# Patient Record
Sex: Female | Born: 1960 | Race: White | Hispanic: No | Smoking: Never smoker
Health system: Southern US, Community
[De-identification: ages and names within clinical notes are randomized; demographics above are authoritative.]

## PROBLEM LIST (undated history)

## (undated) DIAGNOSIS — M1990 Unspecified osteoarthritis, unspecified site: Secondary | ICD-10-CM

## (undated) DIAGNOSIS — S93409A Sprain of unspecified ligament of unspecified ankle, initial encounter: Secondary | ICD-10-CM

## (undated) HISTORY — PX: WISDOM TOOTH EXTRACTION: SHX21

## (undated) HISTORY — DX: Unspecified osteoarthritis, unspecified site: M19.90

## (undated) HISTORY — DX: Sprain of unspecified ligament of unspecified ankle, initial encounter: S93.409A

---

## 1995-10-14 HISTORY — PX: TUBAL LIGATION: SHX77

## 2005-10-13 HISTORY — PX: LYMPHADENECTOMY: SHX15

## 2006-04-03 ENCOUNTER — Ambulatory Visit: Payer: Self-pay | Admitting: Unknown Physician Specialty

## 2007-06-04 ENCOUNTER — Ambulatory Visit: Payer: Self-pay | Admitting: Rheumatology

## 2008-02-10 ENCOUNTER — Ambulatory Visit: Payer: Self-pay | Admitting: Obstetrics and Gynecology

## 2008-05-26 ENCOUNTER — Ambulatory Visit: Payer: Self-pay | Admitting: Unknown Physician Specialty

## 2009-04-02 ENCOUNTER — Ambulatory Visit: Payer: Self-pay | Admitting: Obstetrics and Gynecology

## 2010-05-09 ENCOUNTER — Ambulatory Visit: Payer: Self-pay | Admitting: Obstetrics and Gynecology

## 2011-05-15 ENCOUNTER — Ambulatory Visit: Payer: Self-pay | Admitting: Obstetrics and Gynecology

## 2011-05-30 ENCOUNTER — Ambulatory Visit: Payer: Self-pay | Admitting: Unknown Physician Specialty

## 2012-05-17 ENCOUNTER — Ambulatory Visit: Payer: Self-pay | Admitting: Obstetrics and Gynecology

## 2013-05-23 ENCOUNTER — Ambulatory Visit: Payer: Self-pay | Admitting: Obstetrics and Gynecology

## 2013-12-15 ENCOUNTER — Ambulatory Visit: Payer: Self-pay | Admitting: Family Medicine

## 2014-01-04 ENCOUNTER — Ambulatory Visit: Payer: Self-pay | Admitting: Unknown Physician Specialty

## 2014-05-25 ENCOUNTER — Ambulatory Visit: Payer: Self-pay | Admitting: Obstetrics and Gynecology

## 2014-10-23 DIAGNOSIS — K449 Diaphragmatic hernia without obstruction or gangrene: Secondary | ICD-10-CM | POA: Insufficient documentation

## 2015-05-17 ENCOUNTER — Ambulatory Visit (INDEPENDENT_AMBULATORY_CARE_PROVIDER_SITE_OTHER): Payer: BLUE CROSS/BLUE SHIELD | Admitting: Physician Assistant

## 2015-05-17 ENCOUNTER — Other Ambulatory Visit: Payer: Self-pay

## 2015-05-17 ENCOUNTER — Encounter: Payer: Self-pay | Admitting: Physician Assistant

## 2015-05-17 VITALS — BP 122/78 | HR 84 | Temp 97.8°F | Resp 16 | Wt 139.8 lb

## 2015-05-17 DIAGNOSIS — K648 Other hemorrhoids: Secondary | ICD-10-CM | POA: Diagnosis not present

## 2015-05-17 DIAGNOSIS — R1030 Lower abdominal pain, unspecified: Secondary | ICD-10-CM

## 2015-05-17 DIAGNOSIS — M3130 Wegener's granulomatosis without renal involvement: Secondary | ICD-10-CM | POA: Insufficient documentation

## 2015-05-17 DIAGNOSIS — A499 Bacterial infection, unspecified: Secondary | ICD-10-CM | POA: Diagnosis not present

## 2015-05-17 DIAGNOSIS — R002 Palpitations: Secondary | ICD-10-CM | POA: Insufficient documentation

## 2015-05-17 DIAGNOSIS — IMO0001 Reserved for inherently not codable concepts without codable children: Secondary | ICD-10-CM | POA: Insufficient documentation

## 2015-05-17 DIAGNOSIS — N760 Acute vaginitis: Secondary | ICD-10-CM | POA: Diagnosis not present

## 2015-05-17 DIAGNOSIS — R030 Elevated blood-pressure reading, without diagnosis of hypertension: Secondary | ICD-10-CM

## 2015-05-17 DIAGNOSIS — F439 Reaction to severe stress, unspecified: Secondary | ICD-10-CM | POA: Insufficient documentation

## 2015-05-17 DIAGNOSIS — K644 Residual hemorrhoidal skin tags: Secondary | ICD-10-CM

## 2015-05-17 DIAGNOSIS — K219 Gastro-esophageal reflux disease without esophagitis: Secondary | ICD-10-CM | POA: Insufficient documentation

## 2015-05-17 DIAGNOSIS — B9689 Other specified bacterial agents as the cause of diseases classified elsewhere: Secondary | ICD-10-CM

## 2015-05-17 LAB — POCT URINALYSIS DIPSTICK
BILIRUBIN UA: NEGATIVE
Glucose, UA: NEGATIVE
KETONES UA: NEGATIVE
LEUKOCYTES UA: NEGATIVE
NITRITE UA: NEGATIVE
PROTEIN UA: NEGATIVE
RBC UA: NEGATIVE
SPEC GRAV UA: 1.01
Urobilinogen, UA: 0.2
pH, UA: 7

## 2015-05-17 MED ORDER — HYDROCORTISONE ACETATE 25 MG RE SUPP: 25.0000 mg | Freq: Two times a day (BID) | RECTAL | Status: DC | PRN

## 2015-05-17 MED ORDER — METRONIDAZOLE 1.3 % VA GEL: 1.0000 [in_us] | Freq: Two times a day (BID) | VAGINAL | Status: DC

## 2015-05-17 NOTE — Patient Instructions (Addendum)
Bacterial Vaginosis Bacterial vaginosis is a vaginal infection that occurs when the normal balance of bacteria in the vagina is disrupted. It results from an overgrowth of certain bacteria. This is the most common vaginal infection in women of childbearing age. Treatment is important to prevent complications, especially in pregnant women, as it can cause a premature delivery. CAUSES  Bacterial vaginosis is caused by an increase in harmful bacteria that are normally present in smaller amounts in the vagina. Several different kinds of bacteria can cause bacterial vaginosis. However, the reason that the condition develops is not fully understood. RISK FACTORS Certain activities or behaviors can put you at an increased risk of developing bacterial vaginosis, including:  Having a new sex partner or multiple sex partners.  Douching.  Using an intrauterine device (IUD) for contraception. Women do not get bacterial vaginosis from toilet seats, bedding, swimming pools, or contact with objects around them. SIGNS AND SYMPTOMS  Some women with bacterial vaginosis have no signs or symptoms. Common symptoms include:  Grey vaginal discharge.  A fishlike odor with discharge, especially after sexual intercourse.  Itching or burning of the vagina and vulva.  Burning or pain with urination. DIAGNOSIS  Your health care provider will take a medical history and examine the vagina for signs of bacterial vaginosis. A sample of vaginal fluid may be taken. Your health care provider will look at this sample under a microscope to check for bacteria and abnormal cells. A vaginal pH test may also be done.  TREATMENT  Bacterial vaginosis may be treated with antibiotic medicines. These may be given in the form of a pill or a vaginal cream. A second round of antibiotics may be prescribed if the condition comes back after treatment.  HOME CARE INSTRUCTIONS   Only take over-the-counter or prescription medicines as  directed by your health care provider.  If antibiotic medicine was prescribed, take it as directed. Make sure you finish it even if you start to feel better.  Do not have sex until treatment is completed.  Tell all sexual partners that you have a vaginal infection. They should see their health care provider and be treated if they have problems, such as a mild rash or itching.  Practice safe sex by using condoms and only having one sex partner. SEEK MEDICAL CARE IF:   Your symptoms are not improving after 3 days of treatment.  You have increased discharge or pain.  You have a fever. MAKE SURE YOU:   Understand these instructions.  Will watch your condition.  Will get help right away if you are not doing well or get worse. FOR MORE INFORMATION  Centers for Disease Control and Prevention, Division of STD Prevention: www.cdc.gov/std American Sexual Health Association (ASHA): www.ashastd.org  Document Released: 09/29/2005 Document Revised: 07/20/2013 Document Reviewed: 05/11/2013 ExitCare Patient Information 2015 [employer]. This information is not intended to replace advice given to you by your health care provider. Make sure you discuss any questions you have with your health care provider.  

## 2015-05-17 NOTE — Progress Notes (Signed)
Patient ID: LOGHAN SUBIA, female   DOB: 12-Jan-1961, 54 y.o.   MRN: [id number]   Patient: ADAIJAH ENDRES Female    DOB: 1961/01/29   54 y.o.   MRN: [id number] Visit Date: 05/17/2015  Today's Provider: Mar Daring, PA-C   Chief Complaint  Patient presents with  . Abdominal Pain    lower abdominal pain   Subjective:    HPI Carol Theys is a 54 year old female that comes to the office today with lower abdominal pressure and discomfort. She is concerned she may have a UTI. She did notice increased urinary frequency over the last 2 days but denies any dysuria or hematuria. She does mention that she has had frequent bacterial vaginosis infections and feels she may have one now instead of a UTI because she has had some vaginal discharge.  She denies any fevers, chills, nausea or vomiting, or flank pain.   Previous Medications   BECLOMETHASONE DIPROPIONATE (QNASL) 80 MCG/ACT AERS    Place 1 spray into the nose daily.   CLONAZEPAM (KLONOPIN) 0.5 MG TABLET    Take 0.5-1 tablets by mouth 2 (two) times daily as needed.   CYANOCOBALAMIN (V-R VITAMIN B-12 TR) 1000 MCG TBCR    Take by mouth.   ESOMEPRAZOLE (NEXIUM) 40 MG CAPSULE    Take 1 capsule by mouth daily.   IBUPROFEN (ADVIL,MOTRIN) 200 MG TABLET    Take 200 mg by mouth every 6 (six) hours as needed.   MULTIPLE VITAMIN PO    Take 1 tablet by mouth daily.    Review of Systems  Constitutional: Negative for fever, chills and fatigue.  Gastrointestinal: Positive for abdominal pain. Negative for nausea, vomiting, diarrhea and constipation.  Genitourinary: Positive for frequency, vaginal discharge and pelvic pain (pressure). Negative for dysuria, urgency, hematuria, flank pain, vaginal bleeding, genital sores, vaginal pain, menstrual problem and dyspareunia.    History  Substance Use Topics  . Smoking status: Never Smoker   . Smokeless tobacco: Never Used  . Alcohol Use: 0.0 oz/week    0 Standard drinks or equivalent per week     Comment:  MODERATE USE   Objective:   BP 122/78 mmHg  Pulse 84  Temp(Src) 97.8 F (36.6 C) (Oral)  Resp 16  Wt 139 lb 12.8 oz (63.413 kg)  SpO2 98%  LMP 05/12/2015  Physical Exam  Abdominal: Soft. Bowel sounds are normal. She exhibits no distension and no mass. There is tenderness in the suprapubic area. There is no rigidity, no rebound, no guarding and no CVA tenderness.  Genitourinary: Uterus normal. Pelvic exam was performed with patient supine. There is no rash, tenderness, lesion or injury on the right labia. There is no rash, tenderness, lesion or injury on the left labia. Cervix exhibits no motion tenderness, no discharge and no friability. Right adnexum displays no mass, no tenderness and no fullness. Left adnexum displays no mass, no tenderness and no fullness. There is erythema in the vagina. No tenderness or bleeding in the vagina. No foreign body around the vagina. No signs of injury around the vagina. Vaginal discharge (thick, white) found.  Vitals reviewed.       Assessment & Plan:     1. Lower abdominal pain - POCT urinalysis dipstick - POCT Wet Prep [facility])  2. Bacterial vaginosis Cells visualized on wet prep. She has asked for the metronidazole gel versus the pill. Metronidazole prescription sent in as below.  *Of note: Pharmacy called and stated they only had metronidazole 0.75% gel  in the tube thus the prescription was changed to the 0.75 versus the 1.3%. - MetroNIDAZOLE 1.3 % GEL; Place 1 inch vaginally 2 (two) times daily.  Dispense: 1 Tube; Refill: 0  3. External hemorrhoid Stable and well controlled with the Anusol. She asked for a refill. - hydrocortisone (ANUSOL-HC) 25 MG suppository; Place 1 suppository (25 mg total) rectally 2 (two) times daily as needed for hemorrhoids or itching.  Dispense: 12 suppository; Refill: 1   Follow up: No Follow-up on file.

## 2015-05-18 ENCOUNTER — Telehealth: Payer: Self-pay | Admitting: Family Medicine

## 2015-05-18 LAB — POCT WET PREP (WET MOUNT)

## 2015-05-18 NOTE — Telephone Encounter (Signed)
Has a RX that he has a question about.  Thanks, Con Memos

## 2015-08-21 ENCOUNTER — Other Ambulatory Visit: Payer: Self-pay | Admitting: Obstetrics and Gynecology

## 2015-08-21 DIAGNOSIS — Z1231 Encounter for screening mammogram for malignant neoplasm of breast: Secondary | ICD-10-CM

## 2015-08-30 ENCOUNTER — Ambulatory Visit
Admission: RE | Admit: 2015-08-30 | Discharge: 2015-08-30 | Disposition: A | Discharge status: Home / Self Care | Payer: BLUE CROSS/BLUE SHIELD | Source: Ambulatory Visit | Attending: Obstetrics and Gynecology | Admitting: Obstetrics and Gynecology

## 2015-08-30 DIAGNOSIS — Z1231 Encounter for screening mammogram for malignant neoplasm of breast: Secondary | ICD-10-CM | POA: Diagnosis present

## 2015-11-07 ENCOUNTER — Ambulatory Visit (INDEPENDENT_AMBULATORY_CARE_PROVIDER_SITE_OTHER): Payer: BLUE CROSS/BLUE SHIELD | Admitting: Family Medicine

## 2015-11-07 ENCOUNTER — Encounter: Payer: Self-pay | Admitting: Family Medicine

## 2015-11-07 VITALS — BP 112/72 | HR 82 | Temp 98.1°F | Resp 16 | Ht 63.0 in | Wt 142.0 lb

## 2015-11-07 DIAGNOSIS — K219 Gastro-esophageal reflux disease without esophagitis: Secondary | ICD-10-CM

## 2015-11-07 DIAGNOSIS — G4700 Insomnia, unspecified: Secondary | ICD-10-CM | POA: Insufficient documentation

## 2015-11-07 MED ORDER — ESOMEPRAZOLE MAGNESIUM 40 MG PO CPDR: 40.0000 mg | DELAYED_RELEASE_CAPSULE | Freq: Every day | ORAL | Status: DC

## 2015-11-07 MED ORDER — ZOLPIDEM TARTRATE 5 MG PO TABS: 5.0000 mg | ORAL_TABLET | Freq: Every evening | ORAL | Status: DC | PRN

## 2015-11-07 MED ORDER — ZOLPIDEM TARTRATE 5 MG PO TABS
5.0000 mg | ORAL_TABLET | Freq: Every evening | ORAL | Status: DC | PRN
Start: 2015-11-07 — End: 2015-11-07

## 2015-11-07 NOTE — Progress Notes (Signed)
Patient: Sarah Cox Female    DOB: 02-01-1961   55 y.o.   MRN: [id number] Visit Date: 11/07/2015  Today's Provider: Lelon Huh, MD   Chief Complaint  Patient presents with  . Gastroesophageal Reflux    follow up  . Stress    follow up   Subjective:    HPI  Follow up Acid reflux:  Last office visit was 12/07/2013. Changes made include starting Ranitidine 300mg  every evening.  Since last visit patient has been changed to Nexium 40mg  daily. Patient reports good compliance with treatment and good symptom control.  She ran out of prescription a week and a half ago and started taking OTC Nexium, but is now waking up with heartburn every night.    Stress:  Last office visit was 12/19/2014. Patient was seen bu Carmon Ginsberg PA-C. Patient was started on Clonazepam as needed for stress/ insomnia.  Since last visit patient states her ability to cope with stress has improved and she has not needed to take Clonazepam in several months.    She was previously prescribed Ambien by her gyn which works very well, but takes rarely. Has no adverse effects from medication and she would like new prescription.  Allergies  Allergen Reactions  . Ciprofloxacin Rash  . Sulfa Antibiotics Rash   Previous Medications   BECLOMETHASONE DIPROPIONATE (QNASL) 80 MCG/ACT AERS    Place 1 spray into the nose daily.   CLONAZEPAM (KLONOPIN) 0.5 MG TABLET    Take 0.5-1 tablets by mouth 2 (two) times daily as needed.   CYANOCOBALAMIN (V-R VITAMIN B-12 TR) 1000 MCG TBCR    Take by mouth.   ESOMEPRAZOLE (NEXIUM) 40 MG CAPSULE    Take 1 capsule by mouth daily.   HYDROCORTISONE (ANUSOL-HC) 25 MG SUPPOSITORY    Place 1 suppository (25 mg total) rectally 2 (two) times daily as needed for hemorrhoids or itching.   IBUPROFEN (ADVIL,MOTRIN) 200 MG TABLET    Take 200 mg by mouth every 6 (six) hours as needed.   METRONIDAZOLE 1.3 % GEL    Place 1 inch vaginally 2 (two) times daily.   MULTIPLE VITAMIN PO    Take 1  tablet by mouth daily.    Review of Systems  Constitutional: Negative for fever, chills, appetite change and fatigue.  HENT: Positive for congestion (nasal ), ear discharge (sometimes feels moisture in her ears), rhinorrhea and sneezing. Negative for nosebleeds.   Eyes: Negative for photophobia, pain, discharge, redness, itching and visual disturbance.  Respiratory: Positive for cough (dry). Negative for chest tightness and shortness of breath.   Cardiovascular: Negative for chest pain and palpitations.  Gastrointestinal: Negative for nausea, vomiting and abdominal pain.  Neurological: Negative for dizziness and weakness.    Social History  Substance Use Topics  . Smoking status: Never Smoker   . Smokeless tobacco: Never Used  . Alcohol Use: 0.0 oz/week    0 Standard drinks or equivalent per week     Comment: MODERATE USE   Objective:   BP 112/72 mmHg  Pulse 82  Temp(Src) 98.1 F (36.7 C) (Oral)  Resp 16  Ht 5\' 3"  (1.6 m)  Wt 142 lb (64.411 kg)  BMI 25.16 kg/m2  SpO2 97%  Physical Exam  General appearance: alert, well developed, well nourished, cooperative and in no distress Head: Normocephalic, without obvious abnormality, atraumatic Lungs: Respirations even and unlabored Extremities: No gross deformities Skin: Skin color, texture, turgor normal. No rashes seen  Psych: Appropriate mood  and affect. Neurologic: Mental status: Alert, oriented to person, place, and time, thought content appropriate.     Assessment & Plan:     1. Gastroesophageal reflux disease, esophagitis presence not specified OTC PPIs not effective, but had been doing very well on Prescription nexium - esomeprazole (NEXIUM) 40 MG capsule; Take 1 capsule (40 mg total) by mouth daily.  Dispense: 90 capsule; Refill: 3  2. Insomnia Does well with occasional Amien - zolpidem (AMBIEN) 5 MG tablet; Take 1 tablet (5 mg total) by mouth at bedtime as needed for sleep.  Dispense: 30 tablet; Refill: 1        Lelon Huh, MD  [facility]

## 2015-12-28 ENCOUNTER — Other Ambulatory Visit: Payer: Self-pay | Admitting: Family Medicine

## 2015-12-28 NOTE — Telephone Encounter (Signed)
rx called into [facility]

## 2015-12-28 NOTE — Telephone Encounter (Signed)
Please call in clonazepam  

## 2015-12-28 NOTE — Telephone Encounter (Signed)
Patient calling requesting refill on medication. Please call when ready. Thanks!

## 2016-05-01 ENCOUNTER — Other Ambulatory Visit: Payer: Self-pay | Admitting: Obstetrics and Gynecology

## 2016-05-01 DIAGNOSIS — Z1231 Encounter for screening mammogram for malignant neoplasm of breast: Secondary | ICD-10-CM

## 2016-09-01 ENCOUNTER — Ambulatory Visit: Payer: BLUE CROSS/BLUE SHIELD

## 2016-09-18 ENCOUNTER — Ambulatory Visit: Payer: BLUE CROSS/BLUE SHIELD

## 2016-10-16 ENCOUNTER — Ambulatory Visit
Admission: RE | Admit: 2016-10-16 | Discharge: 2016-10-16 | Disposition: A | Discharge status: Home / Self Care | Payer: PRIVATE HEALTH INSURANCE | Source: Ambulatory Visit | Attending: Obstetrics and Gynecology | Admitting: Obstetrics and Gynecology

## 2016-10-16 DIAGNOSIS — Z1231 Encounter for screening mammogram for malignant neoplasm of breast: Secondary | ICD-10-CM | POA: Diagnosis present

## 2016-10-19 IMAGING — MG MM DIGITAL SCREENING BILAT W/ CAD
4 series · 4 of 4 positions shown · non-contrast
Comparison: Previous exam(s).

CLINICAL DATA: Screening.

EXAM:
DIGITAL SCREENING BILATERAL MAMMOGRAM WITH CAD

[L MLO]
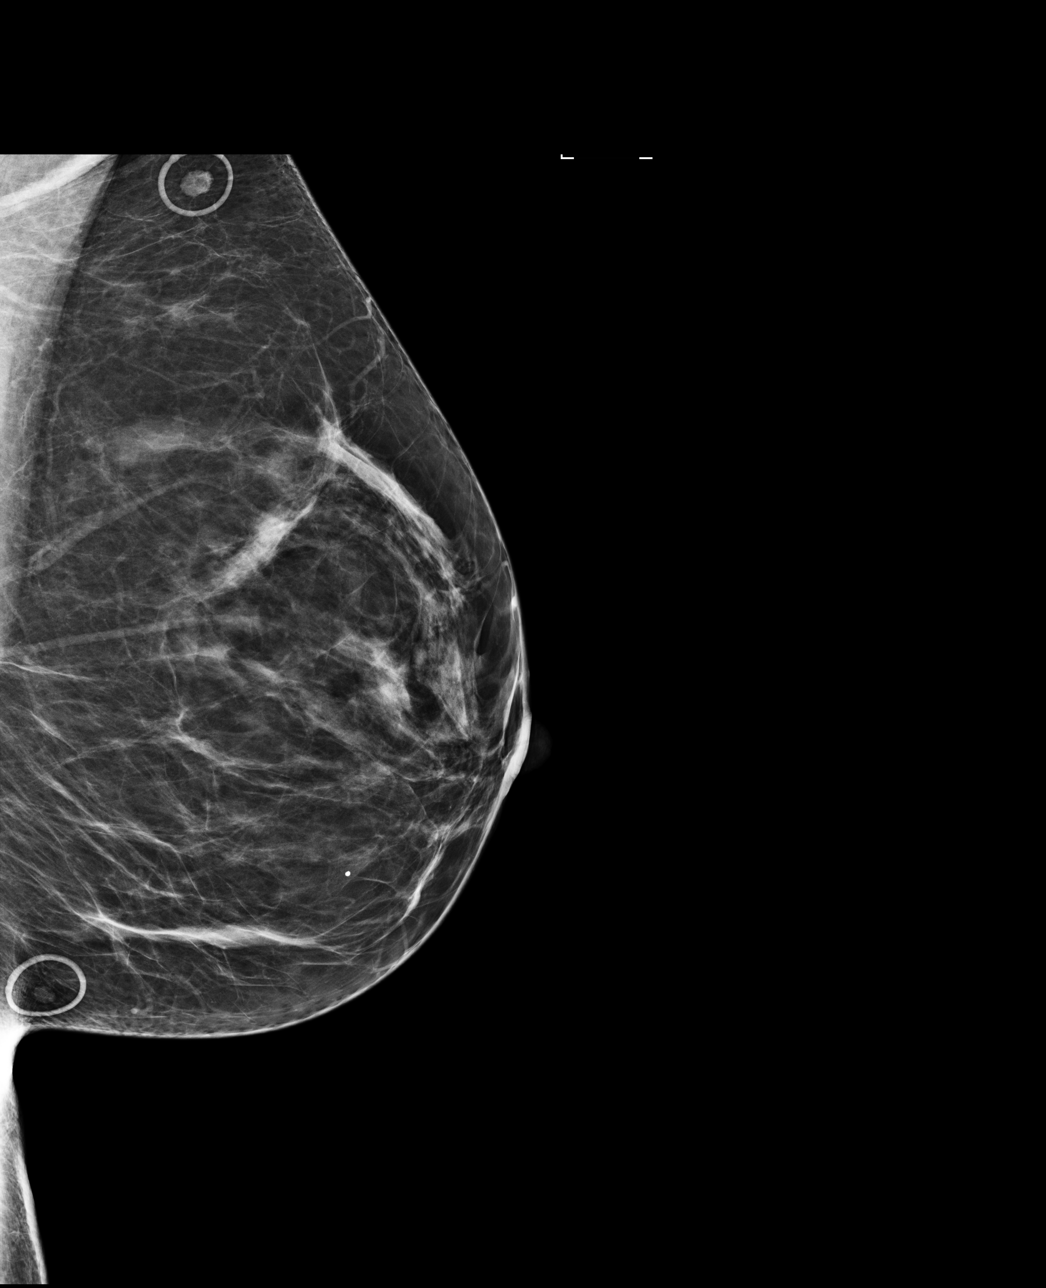

[R CC]
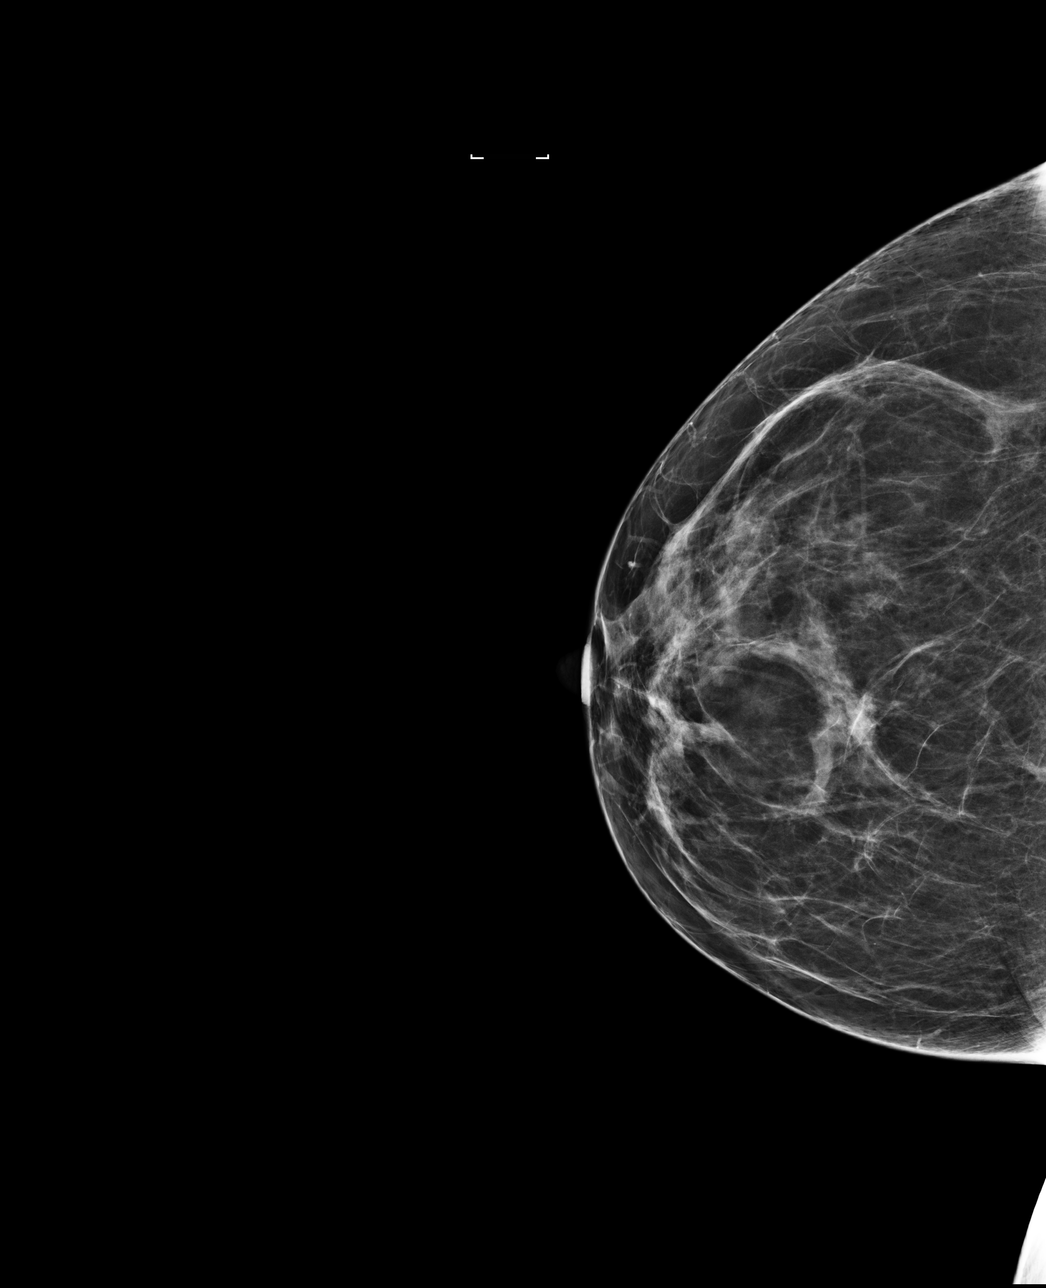

[L CC]
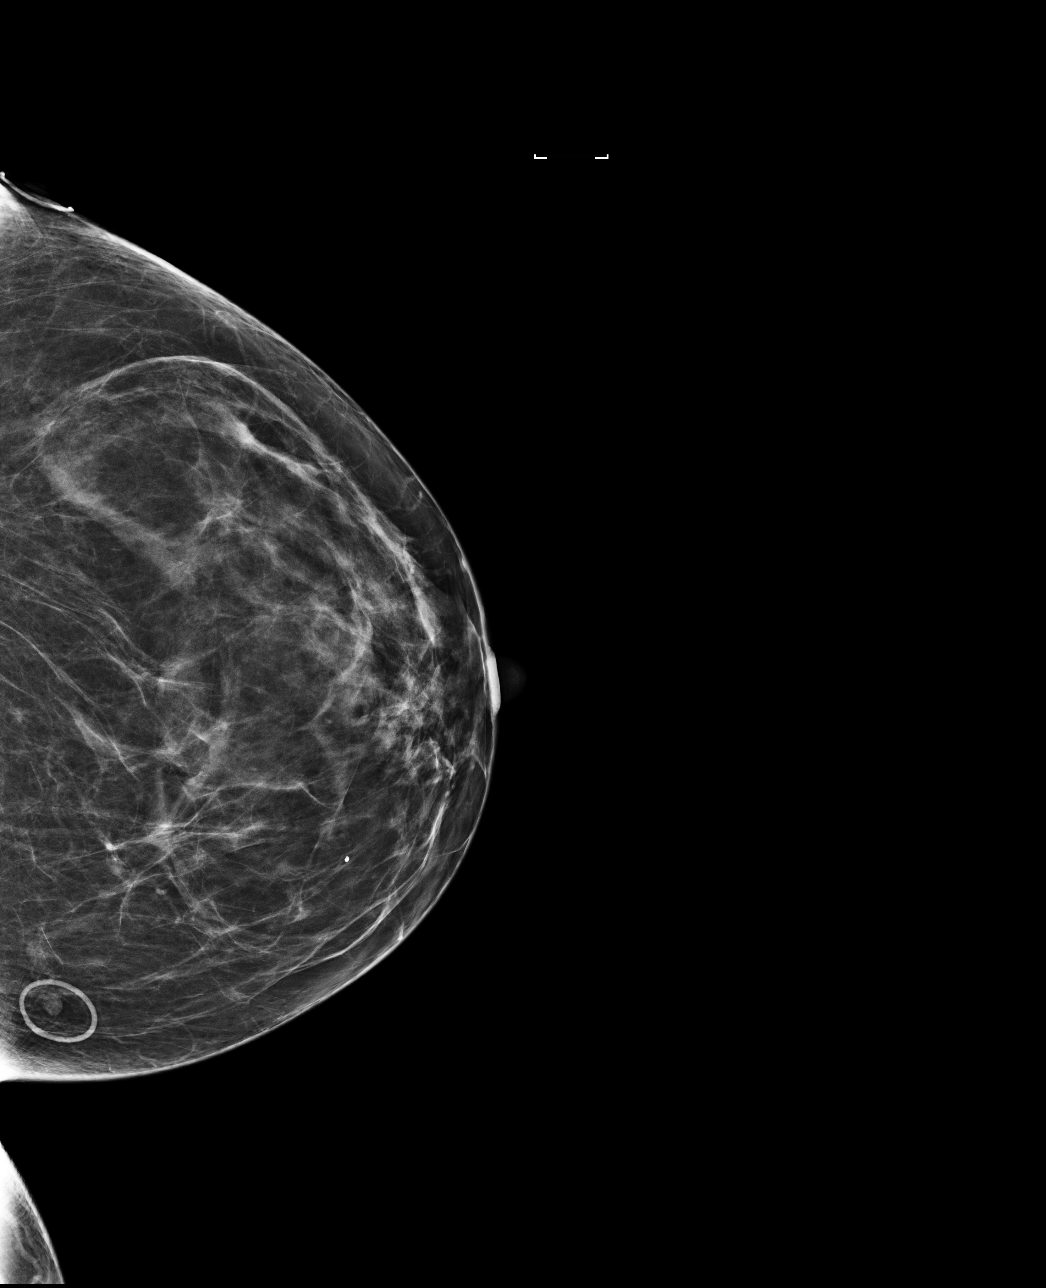

[R MLO]
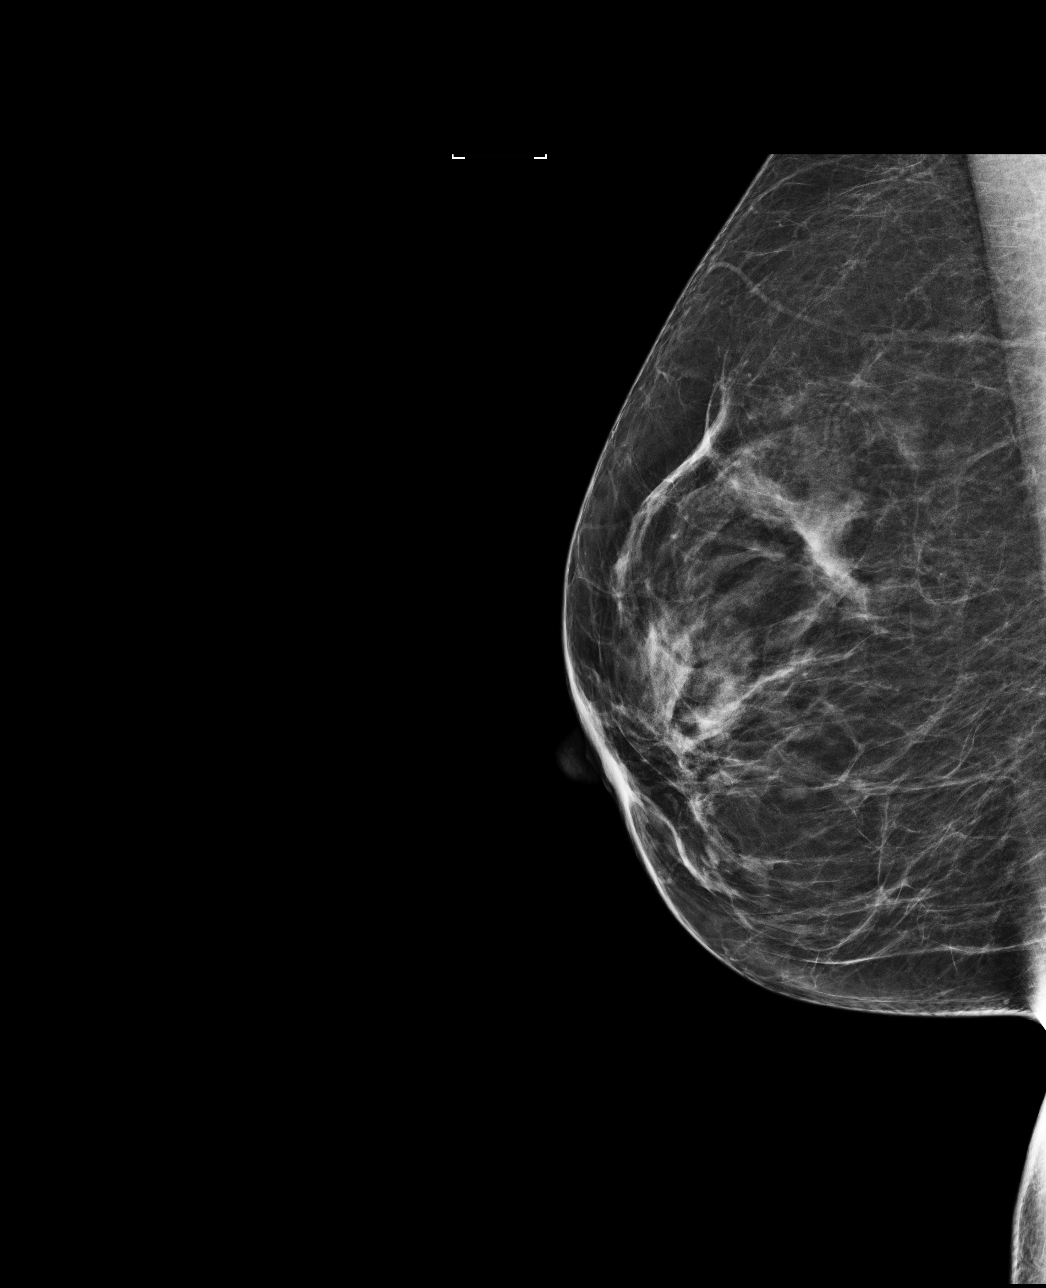

[4 of 4 positions shown; findings below may reference images not displayed]

ACR Breast Density Category b: There are scattered areas of
fibroglandular density.
FINDINGS: There are no findings suspicious for malignancy. Images were
processed with CAD.
IMPRESSION: No mammographic evidence of malignancy. A result letter of this
screening mammogram will be mailed directly to the patient.

RECOMMENDATION:
Screening mammogram in one year. (Code:AS-G-LCT)

BI-RADS CATEGORY  1: Negative.

## 2016-12-25 ENCOUNTER — Other Ambulatory Visit: Payer: Self-pay | Admitting: Family Medicine

## 2016-12-25 NOTE — Telephone Encounter (Signed)
RX called in at [facility] pharmacy  

## 2016-12-25 NOTE — Telephone Encounter (Signed)
Please call in clonazepam  Is due for follow up o.v and needs to schedule in the next 1-2 months.

## 2017-05-27 ENCOUNTER — Ambulatory Visit (INDEPENDENT_AMBULATORY_CARE_PROVIDER_SITE_OTHER): Payer: PRIVATE HEALTH INSURANCE | Admitting: Obstetrics and Gynecology

## 2017-05-27 ENCOUNTER — Encounter: Payer: Self-pay | Admitting: Obstetrics and Gynecology

## 2017-05-27 DIAGNOSIS — Z1329 Encounter for screening for other suspected endocrine disorder: Secondary | ICD-10-CM

## 2017-05-27 DIAGNOSIS — Z1231 Encounter for screening mammogram for malignant neoplasm of breast: Secondary | ICD-10-CM | POA: Diagnosis not present

## 2017-05-27 DIAGNOSIS — Z1321 Encounter for screening for nutritional disorder: Secondary | ICD-10-CM

## 2017-05-27 DIAGNOSIS — Z131 Encounter for screening for diabetes mellitus: Secondary | ICD-10-CM | POA: Diagnosis not present

## 2017-05-27 DIAGNOSIS — D259 Leiomyoma of uterus, unspecified: Secondary | ICD-10-CM

## 2017-05-27 DIAGNOSIS — Z01419 Encounter for gynecological examination (general) (routine) without abnormal findings: Secondary | ICD-10-CM | POA: Diagnosis not present

## 2017-05-27 DIAGNOSIS — Z13220 Encounter for screening for lipoid disorders: Secondary | ICD-10-CM | POA: Diagnosis not present

## 2017-05-27 DIAGNOSIS — Z1239 Encounter for other screening for malignant neoplasm of breast: Secondary | ICD-10-CM

## 2017-05-27 DIAGNOSIS — Z124 Encounter for screening for malignant neoplasm of cervix: Secondary | ICD-10-CM | POA: Diagnosis not present

## 2017-05-27 NOTE — Progress Notes (Signed)
Gynecology Annual Exam  PCP: Birdie Sons, MD  Chief Complaint:  Chief Complaint  Sarah Cox presents with  . Gynecologic Exam    History of Present Illness:Sarah Cox is a 56 y.o. G2P2 presents for annual exam. The Sarah Cox has no complaints today.   LMP: Sarah Cox's last menstrual period was 05/18/2017 (exact date). Average Interval: irregular, 21-35 days Duration of flow: 4-8 days Heavy Menses: no Clots: no Intermenstrual Bleeding: no Postcoital Bleeding: no Dysmenorrhea: no   The Sarah Cox is sexually active. She denies dyspareunia.  The Sarah Cox does perform self breast exams.  There is no notable family history of breast or ovarian cancer in her family.  The Sarah Cox wears seatbelts: yes.   The Sarah Cox has regular exercise: not asked.    The Sarah Cox denies current symptoms of depression.     History of leiomyomata has been followed at [facility] previously.  [place] 05/30/14 three fibroids 1 posterior 35.54mm, right fundal 36.77mm, left anetior 20.40mm.  Bilateral ovaries normal.  Review of Systems: ROS  Past Medical History:  History reviewed. No pertinent past medical history.  Past Surgical History:  Past Surgical History:  Procedure Laterality Date  . CESAREAN SECTION  1992  . LYMPHADENECTOMY  2007   FACE  . TUBAL LIGATION  1997  . WISDOM TOOTH EXTRACTION      Gynecologic History:  Sarah Cox's last menstrual period was 05/18/2017 (exact date). Last Pap: Results were: 05/01/16 ASCUS with NEGATIVE high risk HPV  Last mammogram: 10/16/16 Results were: BI-RAD I Obstetric History: G2P2  Family History:  Family History  Problem Relation Age of Onset  . Osteoporosis Mother   . Breast cancer Mother 60  . Anemia Mother   . Colon cancer Father     Social History:  Social History   Social History  . Marital status: Married    Spouse name: N/A  . Number of children: N/A  . Years of education: N/A   Occupational History  . Not on file.   Social History Main Topics  .  Smoking status: Never Smoker  . Smokeless tobacco: Never Used  . Alcohol use 0.0 oz/week     Comment: MODERATE USE  . Drug use: No  . Sexual activity: Yes    Birth control/ protection: None, Surgical   Other Topics Concern  . Not on file   Social History Narrative  . No narrative on file    Allergies:  Allergies  Allergen Reactions  . Ciprofloxacin Rash  . Sulfa Antibiotics Rash    Medications: Prior to Admission medications   Medication Sig Start Date End Date Taking? Authorizing Provider  clonazePAM (KLONOPIN) 0.5 MG tablet TAKE 1/2 TO 1 TABLET BY MOUTH TWICE DAILY AS NEEDED FOR STRESS OR INSOMNIA 12/25/16  Yes Birdie Sons, MD  hydrocortisone (ANUSOL-HC) 25 MG suppository Place 1 suppository (25 mg total) rectally 2 (two) times daily as needed for hemorrhoids or itching. 05/17/15  Yes Mar Daring, PA-C  ibuprofen (ADVIL,MOTRIN) 200 MG tablet Take 200 mg by mouth every 6 (six) hours as needed.   Yes [provider]  MULTIPLE VITAMIN PO Take 1 tablet by mouth daily.   Yes [provider]  zolpidem (AMBIEN) 5 MG tablet Take 1 tablet (5 mg total) by mouth at bedtime as needed for sleep. 11/07/15  Yes Birdie Sons, MD    Physical Exam Vitals: Blood pressure 106/64, pulse 82, height 5\' 3"  (1.6 m), weight 142 lb (64.4 kg), last menstrual period 05/18/2017.  General:  NAD HEENT: normocephalic, anicteric Thyroid: no enlargement, no palpable nodules Pulmonary: No increased work of breathing, CTAB Cardiovascular: RRR, distal pulses 2+ Breast: Breast symmetrical, no tenderness, no palpable nodules or masses, no skin or nipple retraction present, no nipple discharge.  No axillary or supraclavicular lymphadenopathy. Abdomen: NABS, soft, non-tender, non-distended.  Umbilicus without lesions.  No hepatomegaly, splenomegaly or masses palpable. No evidence of hernia  Genitourinary:  External: Normal external female genitalia.  Normal urethral meatus, normal  Bartholin's and Skene's glands.    Vagina: Normal vaginal mucosa, no evidence of prolapse.    Cervix: Grossly normal in appearance, no bleeding  Uterus:Enlarged 16 week size, mobile, irregular contour.  No CMT  Adnexa: ovaries non-enlarged, no adnexal masses  Rectal: deferred  Lymphatic: no evidence of inguinal lymphadenopathy Extremities: no edema, erythema, or tenderness Neurologic: Grossly intact Psychiatric: mood appropriate, affect full  Female chaperone present for pelvic and breast  portions of the physical exam     Assessment: 56 y.o. G2P2 routine annual exam  Plan: Problem List Items Addressed This Visit    None    Visit Diagnoses    Screening for malignant neoplasm of cervix       Relevant Orders   PapIG, HPV, rfx 16/18   Screening for diabetes mellitus       Relevant Orders   CMP14+LP+TP+TSH+CBC/Plt (Completed)   Screening for lipoid disorders       Relevant Orders   CMP14+LP+TP+TSH+CBC/Plt (Completed)   Breast screening       Encounter for gynecological examination without abnormal finding       Relevant Orders   CMP14+LP+TP+TSH+CBC/Plt (Completed)   Uterine leiomyoma, unspecified location       Relevant Orders   US Transvaginal Non-OB   Thyroid disorder screening       Relevant Orders   CMP14+LP+TP+TSH+CBC/Plt (Completed)   Encounter for vitamin deficiency screening       Relevant Orders   Vitamin D 25 hydroxy (Completed)      1) Mammogram - recommend yearly screening mammogram.  Mammogram Is up to date  2) STI screening was not offered  3) ASCCP guidelines and rational discussed.  Sarah Cox opts for yearly screening interval  4) Osteoporosis  - per USPTF routine screening DEXA at age 73 - FRAX 38 year major fracture risk 6.1%,  10 year hip fracture risk 0.4% - Vitamin D level checked today  5) Routine healthcare maintenance including cholesterol, diabetes screening discussed Ordered today  6) Colonoscopy.  Screening recommended starting at age  80 for average risk individuals, age 50 for individuals deemed at increased risk (including African Americans) and recommended to continue until age 39.  For Sarah Cox age 48-85 individualized approach is recommended.  Gold standard screening is via colonoscopy, Cologuard screening is an acceptable alternative for Sarah Cox unwilling or unable to undergo colonoscopy.  "Colorectal cancer screening for average?risk adults: 2018 guideline update from the American Cancer Society"CA: A Cancer Journal for Clinicians: Mar 11, 2017  - Up to date followed by Dr. Tiffany Kocher family history of colon cancer  7)Leiomyomata - will obtain TVUS to assess interval change in size  8)  Follow up 1 year for routine annual

## 2017-05-27 NOTE — Patient Instructions (Signed)
Preventive Care 40-64 Years, Female Preventive care refers to lifestyle choices and visits with your health care provider that can promote health and wellness. What does preventive care include?  A yearly physical exam. This is also called an annual well check.  Dental exams once or twice a year.  Routine eye exams. Ask your health care provider how often you should have your eyes checked.  Personal lifestyle choices, including: ? Daily care of your teeth and gums. ? Regular physical activity. ? Eating a healthy diet. ? Avoiding tobacco and drug use. ? Limiting alcohol use. ? Practicing safe sex. ? Taking low-dose aspirin daily starting at age 57. ? Taking vitamin and mineral supplements as recommended by your health care provider. What happens during an annual well check? The services and screenings done by your health care provider during your annual well check will depend on your age, overall health, lifestyle risk factors, and family history of disease. Counseling Your health care provider may ask you questions about your:  Alcohol use.  Tobacco use.  Drug use.  Emotional well-being.  Home and relationship well-being.  Sexual activity.  Eating habits.  Work and work Statistician.  Method of birth control.  Menstrual cycle.  Pregnancy history.  Screening You may have the following tests or measurements:  Height, weight, and BMI.  Blood pressure.  Lipid and cholesterol levels. These may be checked every 5 years, or more frequently if you are over 57 years old.  Skin check.  Lung cancer screening. You may have this screening every year starting at age 57 if you have a 30-pack-year history of smoking and currently smoke or have quit within the past 15 years.  Fecal occult blood test (FOBT) of the stool. You may have this test every year starting at age 57.  Flexible sigmoidoscopy or colonoscopy. You may have a sigmoidoscopy every 5 years or a colonoscopy  every 10 years starting at age 30.  Hepatitis C blood test.  Hepatitis B blood test.  Sexually transmitted disease (STD) testing.  Diabetes screening. This is done by checking your blood sugar (glucose) after you have not eaten for a while (fasting). You may have this done every 1-3 years.  Mammogram. This may be done every 1-2 years. Talk to your health care provider about when you should start having regular mammograms. This may depend on whether you have a family history of breast cancer.  BRCA-related cancer screening. This may be done if you have a family history of breast, ovarian, tubal, or peritoneal cancers.  Pelvic exam and Pap test. This may be done every 3 years starting at age 57. Starting at age 57, this may be done every 5 years if you have a Pap test in combination with an HPV test.  Bone density scan. This is done to screen for osteoporosis. You may have this scan if you are at high risk for osteoporosis.  Discuss your test results, treatment options, and if necessary, the need for more tests with your health care provider. Vaccines Your health care provider may recommend certain vaccines, such as:  Influenza vaccine. This is recommended every year.  Tetanus, diphtheria, and acellular pertussis (Tdap, Td) vaccine. You may need a Td booster every 10 years.  Varicella vaccine. You may need this if you have not been vaccinated.  Zoster vaccine. You may need this after age 5.  Measles, mumps, and rubella (MMR) vaccine. You may need at least one dose of MMR if you were born in  1957 or later. You may also need a second dose.  Pneumococcal 13-valent conjugate (PCV13) vaccine. You may need this if you have certain conditions and were not previously vaccinated.  Pneumococcal polysaccharide (PPSV23) vaccine. You may need one or two doses if you smoke cigarettes or if you have certain conditions.  Meningococcal vaccine. You may need this if you have certain  conditions.  Hepatitis A vaccine. You may need this if you have certain conditions or if you travel or work in places where you may be exposed to hepatitis A.  Hepatitis B vaccine. You may need this if you have certain conditions or if you travel or work in places where you may be exposed to hepatitis B.  Haemophilus influenzae type b (Hib) vaccine. You may need this if you have certain conditions.  Talk to your health care provider about which screenings and vaccines you need and how often you need them. This information is not intended to replace advice given to you by your health care provider. Make sure you discuss any questions you have with your health care provider. Document Released: 10/26/2015 Document Revised: 06/18/2016 Document Reviewed: 07/31/2015 Elsevier Interactive Patient Education  2017 [employer].

## 2017-05-28 LAB — CMP14+LP+TP+TSH+CBC/PLT
A/G RATIO: 1.7 (ref 1.2–2.2)
ALBUMIN: 5 g/dL (ref 3.5–5.5)
ALT: 14 IU/L (ref 0–32)
AST: 20 IU/L (ref 0–40)
Alkaline Phosphatase: 79 IU/L (ref 39–117)
BILIRUBIN TOTAL: 0.2 mg/dL (ref 0.0–1.2)
BUN / CREAT RATIO: 17 (ref 9–23)
BUN: 14 mg/dL (ref 6–24)
CO2: 23 mmol/L (ref 20–29)
Calcium: 10.6 mg/dL — ABNORMAL HIGH (ref 8.7–10.2)
Chloride: 100 mmol/L (ref 96–106)
Cholesterol, Total: 201 mg/dL — ABNORMAL HIGH (ref 100–199)
Creatinine, Ser: 0.83 mg/dL (ref 0.57–1.00)
FREE THYROXINE INDEX: 2 (ref 1.2–4.9)
GFR calc Af Amer: 91 mL/min/{1.73_m2} (ref >59)
GFR, EST NON AFRICAN AMERICAN: 79 mL/min/{1.73_m2} (ref >59)
GLOBULIN, TOTAL: 2.9 g/dL (ref 1.5–4.5)
GLUCOSE: 93 mg/dL (ref 65–99)
HDL: 75 mg/dL (ref >39)
HEMOGLOBIN: 16.2 g/dL — AB (ref 11.1–15.9)
Hematocrit: 50.3 % — ABNORMAL HIGH (ref 34.0–46.6)
LDL CALC: 102 mg/dL — AB (ref 0–99)
LDL/HDL RATIO: 1.4 ratio (ref 0.0–3.2)
MCH: 27.8 pg (ref 26.6–33.0)
MCHC: 32.2 g/dL (ref 31.5–35.7)
MCV: 86 fL (ref 79–97)
PLATELETS: 384 10*3/uL — AB (ref 150–379)
POTASSIUM: 4.5 mmol/L (ref 3.5–5.2)
RBC: 5.82 x10E6/uL — AB (ref 3.77–5.28)
RDW: 14.1 % (ref 12.3–15.4)
SODIUM: 139 mmol/L (ref 134–144)
T3 UPTAKE RATIO: 26 % (ref 24–39)
T4, Total: 7.6 ug/dL (ref 4.5–12.0)
TOTAL PROTEIN: 7.9 g/dL (ref 6.0–8.5)
TSH: 2.05 u[IU]/mL (ref 0.450–4.500)
Triglycerides: 122 mg/dL (ref 0–149)
VLDL Cholesterol Cal: 24 mg/dL (ref 5–40)
WBC: 11.2 10*3/uL — AB (ref 3.4–10.8)

## 2017-05-28 LAB — VITAMIN D 25 HYDROXY (VIT D DEFICIENCY, FRACTURES): Vit D, 25-Hydroxy: 31.6 ng/mL (ref 30.0–100.0)

## 2017-05-29 LAB — PAPIG, HPV, RFX 16/18
HPV, high-risk: NEGATIVE
PAP Smear Comment: 0

## 2017-06-26 ENCOUNTER — Ambulatory Visit: Payer: PRIVATE HEALTH INSURANCE | Admitting: Obstetrics and Gynecology

## 2017-06-26 ENCOUNTER — Other Ambulatory Visit: Payer: PRIVATE HEALTH INSURANCE

## 2017-07-17 ENCOUNTER — Ambulatory Visit (INDEPENDENT_AMBULATORY_CARE_PROVIDER_SITE_OTHER): Payer: PRIVATE HEALTH INSURANCE

## 2017-07-17 ENCOUNTER — Encounter: Payer: Self-pay | Admitting: Obstetrics and Gynecology

## 2017-07-17 ENCOUNTER — Ambulatory Visit (INDEPENDENT_AMBULATORY_CARE_PROVIDER_SITE_OTHER): Payer: PRIVATE HEALTH INSURANCE | Admitting: Obstetrics and Gynecology

## 2017-07-17 VITALS — BP 112/82 | HR 71 | Wt 143.0 lb

## 2017-07-17 DIAGNOSIS — N939 Abnormal uterine and vaginal bleeding, unspecified: Secondary | ICD-10-CM | POA: Diagnosis not present

## 2017-07-17 DIAGNOSIS — D251 Intramural leiomyoma of uterus: Secondary | ICD-10-CM

## 2017-07-17 DIAGNOSIS — D259 Leiomyoma of uterus, unspecified: Secondary | ICD-10-CM

## 2017-07-17 DIAGNOSIS — D252 Subserosal leiomyoma of uterus: Secondary | ICD-10-CM

## 2017-07-17 NOTE — Progress Notes (Signed)
Gynecology Ultrasound Follow Up  Chief Complaint:  Chief Complaint  Patient presents with  . GYN U/S     History of Present Illness: Patient is a 56 y.o. female who presents today for ultrasound evaluation of intermenstrual spotting.  Ultrasound demonstrates the following findgins Adnexa: normal in size and appearance small follicles Uterus: Posterior subserosal 60x37mm, anterior subserosal 19x48mm,  Right fundal intramural 54x75mmwith endometrial stripe  5.48mm Additional: no free fluid  Review of Systems: Review of Systems  Constitutional: Negative for chills and fever.  HENT: Negative for congestion.   Respiratory: Negative for cough and shortness of breath.   Cardiovascular: Negative for chest pain and palpitations.  Gastrointestinal: Negative for abdominal pain, constipation, diarrhea, heartburn, nausea and vomiting.  Genitourinary: Negative for dysuria, frequency and urgency.  Skin: Negative for itching and rash.  Neurological: Negative for dizziness and headaches.  Endo/Heme/Allergies: Negative for polydipsia.  Psychiatric/Behavioral: Negative for depression.    Past Medical History:  History reviewed. No pertinent past medical history.  Past Surgical History:  Past Surgical History:  Procedure Laterality Date  . CESAREAN SECTION  1992  . LYMPHADENECTOMY  2007   FACE  . TUBAL LIGATION  1997  . WISDOM TOOTH EXTRACTION      Gynecologic History:  Patient's last menstrual period was 07/07/2017 (exact date). Last Pap: 05/27/17 Results were: .NIL HPV negative  Family History:  Family History  Problem Relation Age of Onset  . Osteoporosis Mother   . Breast cancer Mother 7  . Anemia Mother   . Colon cancer Father     Social History:  Social History   Social History  . Marital status: Married    Spouse name: N/A  . Number of children: N/A  . Years of education: N/A   Occupational History  . Not on file.   Social History Main Topics  . Smoking  status: Never Smoker  . Smokeless tobacco: Never Used  . Alcohol use 0.0 oz/week     Comment: MODERATE USE  . Drug use: No  . Sexual activity: Yes    Birth control/ protection: None, Surgical   Other Topics Concern  . Not on file   Social History Narrative  . No narrative on file    Allergies:  Allergies  Allergen Reactions  . Ciprofloxacin Rash  . Sulfa Antibiotics Rash    Medications: Prior to Admission medications   Medication Sig Start Date End Date Taking? Authorizing Provider  hydrocortisone (ANUSOL-HC) 25 MG suppository Place 1 suppository (25 mg total) rectally 2 (two) times daily as needed for hemorrhoids or itching. 05/17/15  Yes Mar Daring, PA-C  ibuprofen (ADVIL,MOTRIN) 200 MG tablet Take 200 mg by mouth every 6 (six) hours as needed.   Yes [provider]  MULTIPLE VITAMIN PO Take 1 tablet by mouth daily.   Yes [provider]  zolpidem (AMBIEN) 5 MG tablet Take 1 tablet (5 mg total) by mouth at bedtime as needed for sleep. 11/07/15  Yes Birdie Sons, MD  clonazePAM (KLONOPIN) 0.5 MG tablet TAKE 1/2 TO 1 TABLET BY MOUTH TWICE DAILY AS NEEDED FOR STRESS OR INSOMNIA Patient not taking: Reported on 07/17/2017 12/25/16   Birdie Sons, MD    Physical Exam Vitals: Blood pressure 112/82, pulse 71, weight 143 lb (64.9 kg), last menstrual period 07/07/2017.  General: NAD HEENT: normocephalic, anicteric Pulmonary: No increased work of breathing Extremities: no edema, erythema, or tenderness Neurologic: Grossly intact, normal gait Psychiatric: mood appropriate, affect full  ENDOMETRIAL BIOPSY     The indications for endometrial biopsy were reviewed.   Risks of the biopsy including cramping, bleeding, infection, uterine perforation, inadequate specimen and need for additional procedures  were discussed. The patient states she understands and agrees to undergo procedure today. Consent was signed. Time out was performed. Urine HCG was  negative. A Graves speculum was placed and the cervix was brought into view.  The cervix was prepped with Betadine. A single-toothed tenaculum was  placed on the anterior lip of the cervix for traction. The cervix did require dilation.  A 3 mm pipelle was introduced through the cervix into the endometrial cavity without difficulty to a depth of 10cm, and a moderate amount of tissue was obtained, the resulting specime sent to pathology. The instruments were removed from the patient's vagina. Minimal bleeding from the cervix was noted. The patient tolerated the procedure well. Routine post-procedure instructions were given to the patient.  She will be contacted by phone one results become available.     Assessment: 56 y.o. G2P2 with intermenstrual spotting  Plan: Problem List Items Addressed This Visit    None    Visit Diagnoses    Abnormal uterine bleeding    -  Primary   Relevant Orders   Pathology Report   Intramural and subserous leiomyoma of uterus       Relevant Orders   Pathology Report      1) F/U US in 6 months to assess stability of fibroids 2) We discussed that menopause is a clinical diagnosis made after 12 months of amenorrhea.  The average age of menopause in the  [place] population is 50 but there may be significant variation.  Any bleeding that happens after a 12 month period of amenorrhea warrants further work.  Possible etiologies of postmenopausal bleeding were discussed with the patient today.  These may range from benign etiologies such as urethral prolapse and atrophy, to indeterminate lesions such as submucosal fibroids or polyps which would require resection to accurately evaluate. The role of unopposed estrogen in the development of  dndometrial hyperplasia or carcinoma is discussed.  The risk of endometrial hyperplasia is linearly correlated with increasing BMI given the production of estrone by adipose tissue.  Work up will be include transvaginal ultrasound to assess  the thickness of the endometrial lining as well as to assess for focal uterine lesions.  Negative ultrasound evaluation, defined as the absence of focal lesions and endometrial stripe of <61mm, effectively rules out carcinoma and confirms atrophy as the most likely etiology.  Should focal lesions be present these generally require hysteroscopic resection.  Should lining be greater >41mm endometrial biopsy is warranted to rule out hyperplasia or frank endometrial cancer.  Continued episodes of bleeding despite negative ultrasound also warrant endometrial sampling.  As the cervical pathology may also be implicated in postmenopausal bleeding prior cervical cytology was reviewed and repeated if required per ASCCP guidelines. -given age and bleeding pattern endometrial biopsy obtained today  3) A total of 15 minutes were spent in face-to-face contact with the patient during this encounter with over half of that time devoted to counseling and coordination of care.

## 2017-07-21 LAB — PATHOLOGY

## 2017-07-22 ENCOUNTER — Encounter: Payer: Self-pay | Admitting: Obstetrics and Gynecology

## 2017-07-22 ENCOUNTER — Telehealth: Payer: Self-pay | Admitting: Obstetrics and Gynecology

## 2017-07-22 NOTE — Telephone Encounter (Signed)
Pt is calling due to missed called. Please leave detailed message on cell phone due to patient being at work.

## 2018-01-28 ENCOUNTER — Other Ambulatory Visit: Payer: Self-pay | Admitting: Family Medicine

## 2018-01-28 DIAGNOSIS — Z1231 Encounter for screening mammogram for malignant neoplasm of breast: Secondary | ICD-10-CM

## 2018-03-03 ENCOUNTER — Ambulatory Visit
Admission: RE | Admit: 2018-03-03 | Discharge: 2018-03-03 | Disposition: A | Discharge status: Home / Self Care | Payer: PRIVATE HEALTH INSURANCE | Source: Ambulatory Visit | Attending: Family Medicine | Admitting: Family Medicine

## 2018-03-03 ENCOUNTER — Other Ambulatory Visit: Payer: Self-pay | Admitting: Family Medicine

## 2018-03-03 DIAGNOSIS — Z1231 Encounter for screening mammogram for malignant neoplasm of breast: Secondary | ICD-10-CM

## 2018-03-10 ENCOUNTER — Encounter: Payer: Self-pay | Admitting: Obstetrics and Gynecology

## 2018-05-31 ENCOUNTER — Ambulatory Visit: Payer: PRIVATE HEALTH INSURANCE | Admitting: Obstetrics and Gynecology

## 2018-06-02 ENCOUNTER — Ambulatory Visit: Payer: PRIVATE HEALTH INSURANCE | Admitting: Obstetrics and Gynecology

## 2018-06-10 ENCOUNTER — Other Ambulatory Visit (HOSPITAL_COMMUNITY)
Admission: RE | Admit: 2018-06-10 | Discharge: 2018-06-10 | Disposition: A | Discharge status: Home / Self Care | Payer: PRIVATE HEALTH INSURANCE | Source: Ambulatory Visit | Attending: Obstetrics and Gynecology | Admitting: Obstetrics and Gynecology

## 2018-06-10 ENCOUNTER — Ambulatory Visit (INDEPENDENT_AMBULATORY_CARE_PROVIDER_SITE_OTHER): Payer: PRIVATE HEALTH INSURANCE | Admitting: Obstetrics and Gynecology

## 2018-06-10 ENCOUNTER — Encounter: Payer: Self-pay | Admitting: Obstetrics and Gynecology

## 2018-06-10 VITALS — BP 110/66 | HR 80 | Ht 63.0 in | Wt 149.0 lb

## 2018-06-10 DIAGNOSIS — Z1231 Encounter for screening mammogram for malignant neoplasm of breast: Secondary | ICD-10-CM | POA: Diagnosis not present

## 2018-06-10 DIAGNOSIS — N939 Abnormal uterine and vaginal bleeding, unspecified: Secondary | ICD-10-CM | POA: Diagnosis not present

## 2018-06-10 DIAGNOSIS — Z01419 Encounter for gynecological examination (general) (routine) without abnormal findings: Secondary | ICD-10-CM | POA: Diagnosis not present

## 2018-06-10 DIAGNOSIS — N951 Menopausal and female climacteric states: Secondary | ICD-10-CM

## 2018-06-10 DIAGNOSIS — Z1239 Encounter for other screening for malignant neoplasm of breast: Secondary | ICD-10-CM

## 2018-06-10 DIAGNOSIS — Z124 Encounter for screening for malignant neoplasm of cervix: Secondary | ICD-10-CM | POA: Insufficient documentation

## 2018-06-10 MED ORDER — ZOLPIDEM TARTRATE 5 MG PO TABS: 5.0000 mg | ORAL_TABLET | Freq: Every evening | ORAL | 4 refills | Status: DC | PRN

## 2018-06-10 NOTE — Progress Notes (Signed)
Gynecology Annual Exam  PCP: Birdie Sons, MD  Chief Complaint:  Chief Complaint  Patient presents with  . Gynecologic Exam    Still spotty episodes    History of Present Illness:Patient is a 57 y.o. G2P2 presents for annual exam. The patient has no complaints today.   LMP: No LMP recorded. Patient is perimenopausal. In the past year only one episode of spotting in January.  She did have some spotting start up today as well.  Prior evaluation last year included ultrasound with intramural fibroids as well as endometrial biopsy.  The patient is sexually active. She denies dyspareunia.  The patient does perform self breast exams.  There is no notable family history of breast or ovarian cancer in her family.  The patient wears seatbelts: yes.   The patient has regular exercise: not asked.    The patient denies current symptoms of depression.     Review of Systems: Review of Systems  Constitutional: Negative for chills and fever.  HENT: Negative for congestion.   Respiratory: Negative for cough and shortness of breath.   Cardiovascular: Negative for chest pain and palpitations.  Gastrointestinal: Negative for abdominal pain, constipation, diarrhea, heartburn, nausea and vomiting.  Genitourinary: Negative for dysuria, frequency and urgency.  Skin: Negative for itching and rash.  Neurological: Negative for dizziness and headaches.  Endo/Heme/Allergies: Negative for polydipsia.  Psychiatric/Behavioral: Negative for depression.   Past Medical History:  History reviewed. No pertinent past medical history.  Past Surgical History:  Past Surgical History:  Procedure Laterality Date  . CESAREAN SECTION  1992  . LYMPHADENECTOMY  2007   FACE  . TUBAL LIGATION  1997  . WISDOM TOOTH EXTRACTION      Gynecologic History:  No LMP recorded. Patient is perimenopausal. Last Pap: Results were: 05/27/2017 NIL and HR HPV negative  Last mammogram: 03/03/18 Results were: BI-RAD  I Endometrial biopsy 07/16/2017 Disorderly proliferative endometrium Ultrasound 07/17/2017 EMS of 5.48mm, 3 intramural fibroids largest 60 x44mm  Obstetric History: G2P2  Family History:  Family History  Problem Relation Age of Onset  . Osteoporosis Mother   . Breast cancer Mother 29  . Anemia Mother   . Colon cancer Father     Social History:  Social History   Socioeconomic History  . Marital status: Married    Spouse name: Not on file  . Number of children: Not on file  . Years of education: Not on file  . Highest education level: Not on file  Occupational History  . Not on file  Social Needs  . Financial resource strain: Not on file  . Food insecurity:    Worry: Not on file    Inability: Not on file  . Transportation needs:    Medical: Not on file    Non-medical: Not on file  Tobacco Use  . Smoking status: Never Smoker  . Smokeless tobacco: Never Used  Substance and Sexual Activity  . Alcohol use: Yes    Alcohol/week: 0.0 standard drinks    Comment: MODERATE USE  . Drug use: No  . Sexual activity: Yes    Birth control/protection: None, Surgical  Lifestyle  . Physical activity:    Days per week: Not on file    Minutes per session: Not on file  . Stress: Not on file  Relationships  . Social connections:    Talks on phone: Not on file    Gets together: Not on file    Attends religious service: Not on  file    Active member of club or organization: Not on file    Attends meetings of clubs or organizations: Not on file    Relationship status: Not on file  . Intimate partner violence:    Fear of current or ex partner: Not on file    Emotionally abused: Not on file    Physically abused: Not on file    Forced sexual activity: Not on file  Other Topics Concern  . Not on file  Social History Narrative  . Not on file    Allergies:  Allergies  Allergen Reactions  . Ciprofloxacin Rash  . Sulfa Antibiotics Rash    Medications: Prior to Admission  medications   Medication Sig Start Date End Date Taking? Authorizing Provider  ibuprofen (ADVIL,MOTRIN) 200 MG tablet Take 200 mg by mouth every 6 (six) hours as needed.   Yes [provider]  MULTIPLE VITAMIN PO Take 1 tablet by mouth daily.   Yes [provider]  zolpidem (AMBIEN) 5 MG tablet Take 1 tablet (5 mg total) by mouth at bedtime as needed for sleep. 11/07/15  Yes Birdie Sons, MD  clonazePAM (KLONOPIN) 0.5 MG tablet TAKE 1/2 TO 1 TABLET BY MOUTH TWICE DAILY AS NEEDED FOR STRESS OR INSOMNIA Patient not taking: Reported on 07/17/2017 12/25/16   Birdie Sons, MD  hydrocortisone (ANUSOL-HC) 25 MG suppository Place 1 suppository (25 mg total) rectally 2 (two) times daily as needed for hemorrhoids or itching. Patient not taking: Reported on 06/10/2018 05/17/15   Mar Daring, PA-C    Physical Exam Vitals: Blood pressure 110/66, pulse 80, height 5\' 3"  (1.6 m), weight 149 lb (67.6 kg).  General: NAD HEENT: normocephalic, anicteric Thyroid: no enlargement, no palpable nodules Pulmonary: No increased work of breathing, CTAB Cardiovascular: RRR, distal pulses 2+ Breast: Breast symmetrical, no tenderness, no palpable nodules or masses, no skin or nipple retraction present, no nipple discharge.  No axillary or supraclavicular lymphadenopathy. Abdomen: NABS, soft, non-tender, non-distended.  Umbilicus without lesions.  No hepatomegaly, splenomegaly or masses palpable. No evidence of hernia  Genitourinary:  External: Normal external female genitalia.  Normal urethral meatus, normal Bartholin's and Skene's glands.    Vagina: Normal vaginal mucosa, no evidence of prolapse.    Cervix: Grossly normal in appearance, no bleeding  Uterus: Enlarged 16 week [place], mobile, abnormal contour.  No CMT  Adnexa: ovaries non-enlarged, no adnexal masses  Rectal: deferred  Lymphatic: no evidence of inguinal lymphadenopathy Extremities: no edema, erythema, or tenderness Neurologic:  Grossly intact Psychiatric: mood appropriate, affect full  Female chaperone present for pelvic and breast  portions of the physical exam     Assessment: 57 y.o. G2P2 routine annual exam  Plan: Problem List Items Addressed This Visit    None    Visit Diagnoses    Encounter for gynecological examination without abnormal finding    -  Primary   Screening for malignant neoplasm of cervix       Relevant Orders   Cytology - PAP   Breast screening       Abnormal uterine bleeding       Relevant Orders   FSH (Completed)   Estradiol (Completed)   Perimenopausal vasomotor symptoms       Relevant Orders   FSH (Completed)   Estradiol (Completed)      1) Mammogram - recommend yearly screening mammogram.  Mammogram Is up to date  2) STI screening  was notoffered and therefore not obtained  3) ASCCP  guidelines and rational discussed.  Patient opts for yearly screening interval  4) Osteoporosis  - per USPTF routine screening DEXA at age 21   5) Routine healthcare maintenance including cholesterol, diabetes screening discussed managed by PCP  6) Colonoscopy - up to date done by Dr. Tiffany Kocher  7) AUB - FSH/Estradiol today.  If appears to be postmenopausal TVUS to evaluate endometrial lining  8)  No follow-ups on file.    Malachy Mood, MD [facility], [facility] 06/10/2018, 3:42 PM

## 2018-06-11 LAB — ESTRADIOL: ESTRADIOL: 33 pg/mL

## 2018-06-11 LAB — FOLLICLE STIMULATING HORMONE: FSH: 40.6 m[IU]/mL

## 2018-06-15 ENCOUNTER — Telehealth: Payer: Self-pay | Admitting: Obstetrics and Gynecology

## 2018-06-15 NOTE — Telephone Encounter (Signed)
Patient is schedule 06/24/18. Patient also would like a call from Dr. Shelda Pal about her lab results and also has question to why she needs an ultrasound. Please advise

## 2018-06-15 NOTE — Telephone Encounter (Signed)
-----   Message from Malachy Mood, MD sent at 06/12/2018 11:30 AM EDT ----- Regarding: Appointment Needs TVUS next 1-2 weeks abnormal uterine bleeding

## 2018-06-16 LAB — CYTOLOGY - PAP
Diagnosis: NEGATIVE
HPV (WINDOPATH): NOT DETECTED

## 2018-06-24 ENCOUNTER — Ambulatory Visit (INDEPENDENT_AMBULATORY_CARE_PROVIDER_SITE_OTHER): Payer: PRIVATE HEALTH INSURANCE

## 2018-06-24 ENCOUNTER — Ambulatory Visit (INDEPENDENT_AMBULATORY_CARE_PROVIDER_SITE_OTHER): Payer: PRIVATE HEALTH INSURANCE | Admitting: Obstetrics and Gynecology

## 2018-06-24 ENCOUNTER — Encounter: Payer: Self-pay | Admitting: Obstetrics and Gynecology

## 2018-06-24 VITALS — BP 110/80 | HR 74 | Wt 147.0 lb

## 2018-06-24 DIAGNOSIS — D252 Subserosal leiomyoma of uterus: Secondary | ICD-10-CM

## 2018-06-24 DIAGNOSIS — N950 Postmenopausal bleeding: Secondary | ICD-10-CM | POA: Diagnosis not present

## 2018-06-24 DIAGNOSIS — N951 Menopausal and female climacteric states: Secondary | ICD-10-CM | POA: Diagnosis not present

## 2018-06-24 DIAGNOSIS — D251 Intramural leiomyoma of uterus: Secondary | ICD-10-CM

## 2018-06-24 DIAGNOSIS — N939 Abnormal uterine and vaginal bleeding, unspecified: Secondary | ICD-10-CM

## 2018-06-24 NOTE — Patient Instructions (Signed)
We discussed WHI study findings in detail.  In the combined estrogen-progesterone arm breast cancer risk was increased by 1.26 (CI of 1.00 to 1.59), coronary heart disease 1.29 (CI 1.02-1.63), stroke risk 1.41 (1.07-1.85), and pulmonary embolism 2.13 (CI 1.39-3.25).  That being said the while statistically significant the actual number of cases attributable are relatively small at an addition 8 cases of breast cancer, 7 more coronary artery event, 8 more strokes, and 8 additional case of pulmonary embolism per 10,000 women.  Study was terminated because of the increased breast cancer risk, this was not seen in the progestin only arm of the study for women without an intact uterus.  In addition it is important to note that HRT also had positive or risk reducing effects, and all cause mortality between the HRT/non-HRT users is not statistically different.  Estrogen-progestin HRT decreased the relative risk of hip fracture 0.66 (CI 0.45-0.98), colorectal cancer 0.63 (0.43-0.92).  Current consensus is to limit dose to the lowest effective dose, and shortest treatment duration possible.  Breast cancer risk appeared to increase after 4 years of use.  Also important to note is that these risk refer to systemic HRT for the treatment of vasomotor symptoms, and do not apply to vaginal preperations with minimal systemic absorption and aimed at treating symptoms of vulvovaginal atrophy.    We briefly touched on findings of WHIMS trial published in 2005 which looked at women 65 year of age or older, and whether HRT was protective against the development of dementia.  The study revealed that HRT actually increased the risk for the development of dementia but was limited by looking only at patients 65 years of age and older.  The subsequent KEEPS trial  In 2012 which looked at HRT in recently postmenopausal women did not show any improvement in cognitive function for women on HRT.  However, there was also no significant  cognitive declines seen in recently postmenopausal women receiving HRT as had previously been shown in the WHIMS trial.    

## 2018-06-24 NOTE — Progress Notes (Addendum)
Gynecology Ultrasound Follow Up  Chief Complaint:  Chief Complaint  Patient presents with  . Follow-up    GYN U/S     History of Present Illness: Patient is a 57 y.o. female who presents today for ultrasound evaluation of postmenopausal bleeding .  Ultrasound demonstrates the following findgins Adnexa: no masses seen  Uterus: Stable uterine fibroids, with endometrial stripe  7mm Additional: no free fluid  Review of Systems: Review of Systems  Constitutional: Negative.   Gastrointestinal: Negative.   Genitourinary: Negative.   Skin: Negative.     Past Medical History:  History reviewed. No pertinent past medical history.  Past Surgical History:  Past Surgical History:  Procedure Laterality Date  . CESAREAN SECTION  1992  . LYMPHADENECTOMY  2007   FACE  . TUBAL LIGATION  1997  . WISDOM TOOTH EXTRACTION      Gynecologic History:  No LMP recorded. Patient is perimenopausal.  Family History:  Family History  Problem Relation Age of Onset  . Osteoporosis Mother   . Breast cancer Mother 28  . Anemia Mother   . Colon cancer Father     Social History:  Social History   Socioeconomic History  . Marital status: Married    Spouse name: Not on file  . Number of children: Not on file  . Years of education: Not on file  . Highest education level: Not on file  Occupational History  . Not on file  Social Needs  . Financial resource strain: Not on file  . Food insecurity:    Worry: Not on file    Inability: Not on file  . Transportation needs:    Medical: Not on file    Non-medical: Not on file  Tobacco Use  . Smoking status: Never Smoker  . Smokeless tobacco: Never Used  Substance and Sexual Activity  . Alcohol use: Yes    Alcohol/week: 0.0 standard drinks    Comment: MODERATE USE  . Drug use: No  . Sexual activity: Yes    Birth control/protection: None, Surgical  Lifestyle  . Physical activity:    Days per week: Not on file    Minutes per session:  Not on file  . Stress: Not on file  Relationships  . Social connections:    Talks on phone: Not on file    Gets together: Not on file    Attends religious service: Not on file    Active member of club or organization: Not on file    Attends meetings of clubs or organizations: Not on file    Relationship status: Not on file  . Intimate partner violence:    Fear of current or ex partner: Not on file    Emotionally abused: Not on file    Physically abused: Not on file    Forced sexual activity: Not on file  Other Topics Concern  . Not on file  Social History Narrative  . Not on file    Allergies:  Allergies  Allergen Reactions  . Ciprofloxacin Rash  . Sulfa Antibiotics Rash    Medications: Prior to Admission medications   Medication Sig Start Date End Date Taking? Authorizing Provider  clonazePAM (KLONOPIN) 0.5 MG tablet TAKE 1/2 TO 1 TABLET BY MOUTH TWICE DAILY AS NEEDED FOR STRESS OR INSOMNIA Patient not taking: Reported on 07/17/2017 12/25/16   Birdie Sons, MD  hydrocortisone (ANUSOL-HC) 25 MG suppository Place 1 suppository (25 mg total) rectally 2 (two) times daily as needed for hemorrhoids  or itching. Patient not taking: Reported on 06/10/2018 05/17/15   Mar Daring, PA-C  ibuprofen (ADVIL,MOTRIN) 200 MG tablet Take 200 mg by mouth every 6 (six) hours as needed.    [provider]  MULTIPLE VITAMIN PO Take 1 tablet by mouth daily.    [provider]  zolpidem (AMBIEN) 5 MG tablet Take 1 tablet (5 mg total) by mouth at bedtime as needed for sleep. 11/07/15   Birdie Sons, MD  zolpidem (AMBIEN) 5 MG tablet Take 1 tablet (5 mg total) by mouth at bedtime as needed for sleep. 06/10/18   Malachy Mood, MD    Physical Exam Vitals: Blood pressure 110/80, pulse 74, weight 147 lb (66.7 kg).  General: NAD HEENT: normocephalic, anicteric Pulmonary: No increased work of breathing Extremities: no edema, erythema, or tenderness Neurologic: Grossly  intact, normal gait Psychiatric: mood appropriate, affect full   Assessment: 57 y.o. G2P2 No problem-specific Assessment & Plan notes found for this encounter.   Plan: Problem List Items Addressed This Visit    None    Visit Diagnoses    Vasomotor symptoms due to menopause    -  Primary   Postmenopausal bleeding          1) [place] with EMS of 25mm.  Expectant management given decreasing endometrial stripe, and likely transitioning into menopause, negative endometrial biopsy in the past year.    2) HRT - was previously on estrogen several years ago by Dr. Amalia Hailey but discontinued and discussed would not recommend unopposed estrogen given intact uterus.  At presents opts for expectant mangement.  We discussed WHI study findings in detail.  In the combined estrogen-progesterone arm breast cancer risk was increased by 1.26 (CI of 1.00 to 1.59), coronary heart disease 1.29 (CI 1.02-1.63), stroke risk 1.41 (1.07-1.85), and pulmonary embolism 2.13 (CI 1.39-3.25).  That being said the while statistically significant the actual number of cases attributable are relatively small at an addition 8 cases of breast cancer, 7 more coronary artery event, 8 more strokes, and 8 additional case of pulmonary embolism per 10,000 women.  Study was terminated because of the increased breast cancer risk, this was not seen in the progestin only arm of the study for women without an intact uterus.  In addition it is important to note that HRT also had positive or risk reducing effects, and all cause mortality between the HRT/non-HRT users is not statistically different.  Estrogen-progestin HRT decreased the relative risk of hip fracture 0.66 (CI 0.45-0.98), colorectal cancer 0.63 (0.43-0.92).  Current consensus is to limit dose to the lowest effective dose, and shortest treatment duration possible.  Breast cancer risk appeared to increase after 4 years of use.  Also important to note is that these risk refer to systemic HRT  for the treatment of vasomotor symptoms, and do not apply to vaginal preperations with minimal systemic absorption and aimed at treating symptoms of vulvovaginal atrophy.    We briefly touched on findings of WHIMS trial published in 2005 which looked at women 63 year of age or older, and whether HRT was protective against the development of dementia.  The study revealed that HRT actually increased the risk for the development of dementia but was limited by looking only at patients 64 years of age and older.  The subsequent KEEPS trial  In 2012 which looked at HRT in recently postmenopausal women did not show any improvement in cognitive function for women on HRT.  However, there was also no significant cognitive  declines seen in recently postmenopausal women receiving HRT as had previously been shown in the [facility] trial.   3) A total of 15 minutes were spent in face-to-face contact with the patient during this encounter with over half of that time devoted to counseling and coordination of care.  Return in about 1 year (around 06/25/2019) for annual.   Malachy Mood, MD, [facility], [place]

## 2019-02-01 ENCOUNTER — Other Ambulatory Visit: Payer: Self-pay

## 2019-02-01 ENCOUNTER — Ambulatory Visit (INDEPENDENT_AMBULATORY_CARE_PROVIDER_SITE_OTHER): Payer: BLUE CROSS/BLUE SHIELD | Admitting: Family Medicine

## 2019-02-01 ENCOUNTER — Encounter: Payer: Self-pay | Admitting: Family Medicine

## 2019-02-01 VITALS — BP 115/78 | HR 84 | Temp 97.9°F | Resp 18 | Wt 141.0 lb

## 2019-02-01 DIAGNOSIS — J0110 Acute frontal sinusitis, unspecified: Secondary | ICD-10-CM | POA: Diagnosis not present

## 2019-02-01 DIAGNOSIS — F5101 Primary insomnia: Secondary | ICD-10-CM

## 2019-02-01 MED ORDER — ALPRAZOLAM 0.5 MG PO TABS: 0.2500 mg | ORAL_TABLET | Freq: Every evening | ORAL | 0 refills | Status: DC | PRN

## 2019-02-01 MED ORDER — FLUTICASONE PROPIONATE 50 MCG/ACT NA SUSP: 2.0000 | Freq: Every day | NASAL | 6 refills | Status: DC

## 2019-02-01 MED ORDER — AMOXICILLIN 500 MG PO CAPS: 1000.0000 mg | ORAL_CAPSULE | Freq: Three times a day (TID) | ORAL | 0 refills | Status: AC

## 2019-02-01 NOTE — Progress Notes (Signed)
Patient: Sarah Cox Female    DOB: 09/29/1961   58 y.o.   MRN: [id number] Visit Date: 02/01/2019  Today's Provider: Lelon Huh, MD   Chief Complaint  Patient presents with  . URI   Subjective:     URI   This is a new problem. Episode onset: 2-3 weeks ago. The problem has been gradually worsening. There has been no fever. Associated symptoms include coughing (dry), a plugged ear sensation, rhinorrhea, sneezing and a sore throat (scratchy). Pertinent negatives include no abdominal pain, chest pain, congestion, nausea, sinus pain, vomiting or wheezing. Treatments tried: Claritin D, Alkaseltzer plus. The treatment provided no relief.    Allergies  Allergen Reactions  . Ciprofloxacin Rash  . Sulfa Antibiotics Rash     Current Outpatient Medications:  .  clonazePAM (KLONOPIN) 0.5 MG tablet, TAKE 1/2 TO 1 TABLET BY MOUTH TWICE DAILY AS NEEDED FOR STRESS OR INSOMNIA, Disp: 20 tablet, Rfl: 1 .  hydrocortisone (ANUSOL-HC) 25 MG suppository, Place 1 suppository (25 mg total) rectally 2 (two) times daily as needed for hemorrhoids or itching., Disp: 12 suppository, Rfl: 1 .  ibuprofen (ADVIL,MOTRIN) 200 MG tablet, Take 200 mg by mouth every 6 (six) hours as needed., Disp: , Rfl:  .  Loratadine-Pseudoephedrine (CLARITIN-D 24 HOUR PO), Take 1 tablet by mouth daily., Disp: , Rfl:  .  MULTIPLE VITAMIN PO, Take 1 tablet by mouth daily., Disp: , Rfl:  .  zolpidem (AMBIEN) 5 MG tablet, Take 1 tablet (5 mg total) by mouth at bedtime as needed for sleep., Disp: 30 tablet, Rfl: 4  Review of Systems  Constitutional: Positive for chills, diaphoresis and fatigue. Negative for appetite change and fever.  HENT: Positive for rhinorrhea, sinus pressure, sneezing and sore throat (scratchy). Negative for congestion and sinus pain.   Respiratory: Positive for cough (dry). Negative for chest tightness, shortness of breath and wheezing.   Cardiovascular: Negative for chest pain and palpitations.   Gastrointestinal: Negative for abdominal pain, nausea and vomiting.  Neurological: Positive for dizziness. Negative for weakness.    Social History   Tobacco Use  . Smoking status: Never Smoker  . Smokeless tobacco: Never Used  Substance Use Topics  . Alcohol use: Yes    Alcohol/week: 0.0 standard drinks    Comment: MODERATE USE      Objective:   BP 115/78 (BP Location: Left Arm, Patient Position: Sitting, Cuff Size: Normal)   Pulse 84   Temp 97.9 F (36.6 C) (Oral)   Resp 18   Wt 141 lb (64 kg)   SpO2 98% Comment: room air  BMI 24.98 kg/m  Vitals:   02/01/19 0831  BP: 115/78  Pulse: 84  Resp: 18  Temp: 97.9 F (36.6 C)  TempSrc: Oral  SpO2: 98%  Weight: 141 lb (64 kg)     Physical Exam  General Appearance:    Alert, cooperative, no distress  HENT:   neck without nodes, frontal sinuses tender and nasal mucosa pale and congested  Eyes:    PERRL, conjunctiva/corneas clear, EOM's intact       Lungs:     Clear to auscultation bilaterally, respirations unlabored  Heart:    Regular rate and rhythm  Neurologic:   Awake, alert, oriented x 3. No apparent focal neurological           defect.         Assessment & Plan    1. Acute frontal sinusitis, recurrence not specified  -  amoxicillin (AMOXIL) 500 MG capsule; Take 2 capsules (1,000 mg total) by mouth 3 (three) times daily for 5 days.  Dispense: 30 capsule; Refill: 0 - fluticasone (FLONASE) 50 MCG/ACT nasal spray; Place 2 sprays into both nostrils daily.  Dispense: 16 g; Refill: 6  2. Primary insomnia  - ALPRAZolam (XANAX) 0.5 MG tablet; Take 0.5-1 tablets (0.25-0.5 mg total) by mouth at bedtime as needed for sleep.  Dispense: 30 tablet; Refill: 0     The entirety of the information documented in the History of Present Illness, Review of Systems and Physical Exam were personally obtained by me. Portions of this information were initially documented by Meyer Cory, CMA and reviewed by me for thoroughness and  accuracy.   Lelon Huh, MD  [facility]

## 2019-02-08 NOTE — Patient Instructions (Signed)
.   Please review the attached list of medications and notify my office if there are any errors.   . Please bring all of your medications to every appointment so we can make sure that our medication list is the same as yours.   

## 2019-06-17 ENCOUNTER — Ambulatory Visit: Payer: PRIVATE HEALTH INSURANCE | Admitting: Obstetrics and Gynecology

## 2019-06-24 ENCOUNTER — Other Ambulatory Visit: Payer: Self-pay

## 2019-06-24 ENCOUNTER — Encounter: Payer: Self-pay | Admitting: Obstetrics and Gynecology

## 2019-06-24 ENCOUNTER — Ambulatory Visit: Payer: BLUE CROSS/BLUE SHIELD | Admitting: Obstetrics and Gynecology

## 2019-06-24 ENCOUNTER — Other Ambulatory Visit: Payer: Self-pay | Admitting: Obstetrics and Gynecology

## 2019-06-24 VITALS — BP 128/80 | HR 71 | Ht 63.0 in | Wt 147.0 lb

## 2019-06-24 DIAGNOSIS — Z1239 Encounter for other screening for malignant neoplasm of breast: Secondary | ICD-10-CM

## 2019-06-27 ENCOUNTER — Other Ambulatory Visit: Payer: Self-pay | Admitting: Obstetrics and Gynecology

## 2019-06-27 DIAGNOSIS — Z800 Family history of malignant neoplasm of digestive organs: Secondary | ICD-10-CM

## 2019-06-27 DIAGNOSIS — Z1211 Encounter for screening for malignant neoplasm of colon: Secondary | ICD-10-CM

## 2019-06-27 DIAGNOSIS — K644 Residual hemorrhoidal skin tags: Secondary | ICD-10-CM

## 2019-06-27 MED ORDER — HYDROCORTISONE ACETATE 25 MG RE SUPP: 25.0000 mg | Freq: Two times a day (BID) | RECTAL | 1 refills | Status: DC | PRN

## 2019-06-29 ENCOUNTER — Encounter: Payer: Self-pay | Admitting: Obstetrics and Gynecology

## 2019-06-29 ENCOUNTER — Other Ambulatory Visit: Payer: Self-pay | Admitting: Obstetrics and Gynecology

## 2019-06-29 DIAGNOSIS — R102 Pelvic and perineal pain: Secondary | ICD-10-CM

## 2019-06-29 NOTE — Telephone Encounter (Signed)
Called and spoke with patient to schedule ultrasound. Patient believes its a slipped disc due to a fall a couple of weeks ago. Patient has gone to [facility] and will call back if she needs u/trasound to be schedule.

## 2019-07-20 ENCOUNTER — Other Ambulatory Visit: Payer: Self-pay | Admitting: Obstetrics and Gynecology

## 2019-07-20 MED ORDER — PREMPRO 0.3-1.5 MG PO TABS: 1.0000 | ORAL_TABLET | Freq: Every day | ORAL | 11 refills | Status: DC

## 2019-08-04 ENCOUNTER — Other Ambulatory Visit: Payer: Self-pay | Admitting: Obstetrics and Gynecology

## 2019-08-04 ENCOUNTER — Telehealth: Payer: Self-pay

## 2019-08-04 MED ORDER — MEDROXYPROGESTERONE ACETATE 2.5 MG PO TABS: 2.5000 mg | ORAL_TABLET | Freq: Every day | ORAL | 11 refills | Status: DC

## 2019-08-04 MED ORDER — ESTRADIOL 0.5 MG PO TABS: 0.5000 mg | ORAL_TABLET | Freq: Every day | ORAL | 11 refills | Status: DC

## 2019-08-04 MED ORDER — MEDROXYPROGESTERONE ACETATE 2.5 MG PO TABS
10.0000 mg | ORAL_TABLET | Freq: Every day | ORAL | 11 refills | Status: DC
Start: 2019-08-04 — End: 2019-08-04

## 2019-08-04 NOTE — Telephone Encounter (Signed)
Please advise and clarify.

## 2019-08-04 NOTE — Telephone Encounter (Signed)
Sarah Cox from [facility] calling; needs clarification on medroxyprogesterone 2.5mg  #30.  Quantity doesn't match directions.  Is it to be taken cyclicly?  [phone]

## 2019-08-11 ENCOUNTER — Ambulatory Visit
Admission: RE | Admit: 2019-08-11 | Discharge: 2019-08-11 | Disposition: A | Discharge status: Home / Self Care | Payer: BLUE CROSS/BLUE SHIELD | Source: Ambulatory Visit | Attending: Obstetrics and Gynecology | Admitting: Obstetrics and Gynecology

## 2019-08-11 DIAGNOSIS — Z1239 Encounter for other screening for malignant neoplasm of breast: Secondary | ICD-10-CM

## 2019-08-11 DIAGNOSIS — Z1231 Encounter for screening mammogram for malignant neoplasm of breast: Secondary | ICD-10-CM | POA: Diagnosis not present

## 2019-09-12 ENCOUNTER — Other Ambulatory Visit: Payer: Self-pay

## 2019-09-12 DIAGNOSIS — Z20822 Contact with and (suspected) exposure to covid-19: Secondary | ICD-10-CM

## 2019-09-13 LAB — NOVEL CORONAVIRUS, NAA: SARS-CoV-2, NAA: NOT DETECTED

## 2019-10-11 ENCOUNTER — Ambulatory Visit: Payer: BLUE CROSS/BLUE SHIELD | Attending: Internal Medicine

## 2019-10-11 DIAGNOSIS — Z20822 Contact with and (suspected) exposure to covid-19: Secondary | ICD-10-CM

## 2019-10-13 LAB — NOVEL CORONAVIRUS, NAA: SARS-CoV-2, NAA: NOT DETECTED

## 2019-11-03 ENCOUNTER — Other Ambulatory Visit: Payer: Self-pay | Admitting: Obstetrics and Gynecology

## 2019-11-03 MED ORDER — GABAPENTIN 100 MG PO CAPS: 100.0000 mg | ORAL_CAPSULE | Freq: Two times a day (BID) | ORAL | 6 refills | Status: DC

## 2019-11-21 ENCOUNTER — Other Ambulatory Visit: Payer: Self-pay | Admitting: Obstetrics and Gynecology

## 2019-11-21 MED ORDER — VALACYCLOVIR HCL 500 MG PO TABS: 500.0000 mg | ORAL_TABLET | Freq: Two times a day (BID) | ORAL | 6 refills | Status: DC

## 2020-02-04 ENCOUNTER — Other Ambulatory Visit: Payer: Self-pay | Admitting: Family Medicine

## 2020-02-04 DIAGNOSIS — J0110 Acute frontal sinusitis, unspecified: Secondary | ICD-10-CM

## 2020-02-04 NOTE — Telephone Encounter (Signed)
Requested Prescriptions  Pending Prescriptions Disp Refills  . fluticasone (FLONASE) 50 MCG/ACT nasal spray [Pharmacy Med Name: FLUTICASONE PROPIONATE 50 MCG/ACT N] 16 g 0    Sig: TAKE 2 SPRAYS INTO BOTH NOSTRILS DAILY     Ear, Nose, and Throat: Nasal Preparations - Corticosteroids Failed - 02/04/2020  9:04 AM      Failed - Valid encounter within last 12 months    Recent Outpatient Visits          1 year ago Acute frontal sinusitis, recurrence not specified   [facility] Birdie Sons, MD   4 years ago Gastroesophageal reflux disease, esophagitis presence not specified   [facility] Birdie Sons, MD   4 years ago Bacterial vaginosis   [place], [place]              Pt a few days overdue for appt. Pt given courtesy refill and instructions to pharmacy for pt to schedule f/u for further refills.

## 2020-03-05 ENCOUNTER — Other Ambulatory Visit: Payer: Self-pay | Admitting: Obstetrics and Gynecology

## 2020-03-05 NOTE — Telephone Encounter (Signed)
Advise

## 2020-05-04 ENCOUNTER — Ambulatory Visit (INDEPENDENT_AMBULATORY_CARE_PROVIDER_SITE_OTHER): Payer: 59 | Admitting: Obstetrics and Gynecology

## 2020-05-04 ENCOUNTER — Other Ambulatory Visit (HOSPITAL_COMMUNITY)
Admission: RE | Admit: 2020-05-04 | Discharge: 2020-05-04 | Disposition: A | Discharge status: Home / Self Care | Payer: 59 | Source: Ambulatory Visit | Attending: Obstetrics and Gynecology | Admitting: Obstetrics and Gynecology

## 2020-05-04 ENCOUNTER — Other Ambulatory Visit: Payer: Self-pay

## 2020-05-04 ENCOUNTER — Encounter: Payer: Self-pay | Admitting: Obstetrics and Gynecology

## 2020-05-04 VITALS — BP 120/90 | Ht 63.0 in | Wt 153.0 lb

## 2020-05-04 DIAGNOSIS — Z124 Encounter for screening for malignant neoplasm of cervix: Secondary | ICD-10-CM | POA: Insufficient documentation

## 2020-05-04 DIAGNOSIS — Z1239 Encounter for other screening for malignant neoplasm of breast: Secondary | ICD-10-CM | POA: Diagnosis not present

## 2020-05-04 DIAGNOSIS — Z1321 Encounter for screening for nutritional disorder: Secondary | ICD-10-CM

## 2020-05-04 DIAGNOSIS — Z131 Encounter for screening for diabetes mellitus: Secondary | ICD-10-CM

## 2020-05-04 DIAGNOSIS — Z01419 Encounter for gynecological examination (general) (routine) without abnormal findings: Secondary | ICD-10-CM | POA: Diagnosis not present

## 2020-05-04 DIAGNOSIS — Z13220 Encounter for screening for lipoid disorders: Secondary | ICD-10-CM

## 2020-05-04 DIAGNOSIS — Z1329 Encounter for screening for other suspected endocrine disorder: Secondary | ICD-10-CM

## 2020-05-04 NOTE — Patient Instructions (Signed)
[facility] [address] [place] [address]  [facility] [place]  [address] [place] [address]  Phone: [phone]  

## 2020-05-04 NOTE — Progress Notes (Signed)
Gynecology Annual Exam  PCP: Birdie Sons, MD  Chief Complaint:  Chief Complaint  Patient presents with  . Gynecologic Exam    History of Present Illness:Patient is a 58 y.o. G2P2 presents for annual exam. The patient has no complaints today.   LMP: Patient's last menstrual period was 07/07/2017 (exact date). No PMB.  Off HRT doing well.  The patient is sexually active. She denies dyspareunia.  The patient does perform self breast exams.  There is no notable family history of breast or ovarian cancer in her family.  The patient wears seatbelts: yes.   The patient has regular exercise: not asked.    The patient denies current symptoms of depression.     Review of Systems: Review of Systems  Constitutional: Negative for chills and fever.  HENT: Negative for congestion.   Respiratory: Negative for cough and shortness of breath.   Cardiovascular: Negative for chest pain and palpitations.  Gastrointestinal: Negative for abdominal pain, constipation, diarrhea, heartburn, nausea and vomiting.  Genitourinary: Negative for dysuria, frequency and urgency.  Skin: Negative for itching and rash.  Neurological: Negative for dizziness and headaches.  Endo/Heme/Allergies: Negative for polydipsia.  Psychiatric/Behavioral: Negative for depression.    Past Medical History:  Patient Active Problem List   Diagnosis Date Noted  . Insomnia 11/07/2015  . Blood pressure elevated 05/17/2015  . Acid reflux 05/17/2015  . Awareness of heartbeats 05/17/2015  . 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 Feeling stressed out 05/17/2015  . 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 CESAREAN SECTION  1992  . LYMPHADENECTOMY  2007   FACE  . TUBAL LIGATION  1997  . WISDOM TOOTH EXTRACTION      Gynecologic History:  Patient's last menstrual period was 07/07/2017 (exact date). Last Pap: Results were:  06/10/2018 NIL and HR HPV negative  Last mammogram: 08/11/2019 Results were: BI-RAD I Endometrial biopsy 07/16/2017 Disorderly proliferative endometrium Ultrasound 07/17/2017 EMS of 5.68mm, 3 intramural fibroids largest 60 x55mm  Obstetric History: G2P2  Family History:  Family History  Problem Relation Age of Onset  . Osteoporosis Mother   . Breast cancer Mother 64  . Anemia Mother   . Colon cancer Father     Social History:  Social History   Socioeconomic History  . Marital status: Divorced    Spouse name: Not on file  . Number of children: Not on file  . Years of education: Not on file  . Highest education level: Not on file  Occupational History  . Not on file  Tobacco Use  . Smoking status: Never Smoker  . Smokeless tobacco: Never Used  Vaping Use  . Vaping Use: Never used  Substance and Sexual Activity  . Alcohol use: Yes    Alcohol/week: 0.0 standard drinks    Comment: MODERATE USE  . Drug use: No  . Sexual activity: Yes    Birth control/protection: None, Surgical  Other Topics Concern  . Not on file  Social History Narrative  . Not on file   Social Determinants of Health   Financial Resource Strain:   . Difficulty of Paying Living Expenses:   Food Insecurity:   . Worried About Charity fundraiser in the Last Year:   . Arboriculturist in the Last Year:   Transportation Needs:   . Film/video editor (Medical):   Marland Kitchen Lack of Transportation (Non-Medical):   Physical Activity:   .  Days of Exercise per Week:   . Minutes of Exercise per Session:   Stress:   . Feeling of Stress :   Social Connections:   . Frequency of Communication with Friends and Family:   . Frequency of Social Gatherings with Friends and Family:   . Attends Religious Services:   . Active Member of Clubs or Organizations:   . Attends Archivist Meetings:   Marland Kitchen Marital Status:   Intimate Partner Violence:   . Fear of Current or Ex-Partner:   . Emotionally Abused:   Marland Kitchen  Physically Abused:   . Sexually Abused:     Allergies:  Allergies  Allergen Reactions  . Ciprofloxacin Rash  . Sulfa Antibiotics Rash    Medications: Prior to Admission medications   Medication Sig Start Date End Date Taking? Authorizing Provider  fluticasone (FLONASE) 50 MCG/ACT nasal spray TAKE 2 SPRAYS INTO BOTH NOSTRILS DAILY 02/04/20  Yes Birdie Sons, MD  hydrocortisone (ANUSOL-HC) 25 MG suppository Place 1 suppository (25 mg total) rectally 2 (two) times daily as needed. 06/27/19  Yes Malachy Mood, MD  ibuprofen (ADVIL,MOTRIN) 200 MG tablet Take 200 mg by mouth every 6 (six) hours as needed.   Yes [provider]  MULTIPLE VITAMIN PO Take 1 tablet by mouth daily.   Yes [provider]  zolpidem (AMBIEN) 5 MG tablet TAKE ONE TABLET BY MOUTH AT BEDTIME AS NEEDED FOR SLEEP 03/07/20  Yes Malachy Mood, MD  Loratadine-Pseudoephedrine (CLARITIN-D 24 HOUR PO) Take 1 tablet by mouth daily.    [provider]    Physical Exam Vitals: Blood pressure (!) 120/90, height 5\' 3"  (1.6 m), weight 153 lb (69.4 kg), last menstrual period 07/07/2017.  General: NAD, well nourished appears stated age 51: normocephalic, anicteric Thyroid: no enlargement, no palpable nodules Pulmonary: No increased work of breathing, CTAB Cardiovascular: RRR, distal pulses 2+ Breast: Breast symmetrical, no tenderness, no palpable nodules or masses, no skin or nipple retraction present, no nipple discharge.  No axillary or supraclavicular lymphadenopathy. Abdomen: NABS, soft, non-tender, non-distended.  Umbilicus without lesions.  No hepatomegaly, splenomegaly or masses palpable. No evidence of hernia  Genitourinary:  External: Normal external female genitalia.  Normal urethral meatus, normal Bartholin's and Skene's glands.    Vagina: Normal vaginal mucosa, no evidence of prolapse.    Cervix: Grossly normal in appearance, no bleeding  Uterus: Non-enlarged, mobile, normal  contour.  No CMT  Adnexa: ovaries non-enlarged, no adnexal masses  Rectal: deferred  Lymphatic: no evidence of inguinal lymphadenopathy Extremities: no edema, erythema, or tenderness Neurologic: Grossly intact Psychiatric: mood appropriate, affect full  Female chaperone present for pelvic and breast  portions of the physical exam     Assessment: 59 y.o. G2P2 routine annual exam  Plan: Problem List Items Addressed This Visit    None    Visit Diagnoses    Encounter for gynecological examination without abnormal finding    -  Primary   Screening for malignant neoplasm of cervix       Relevant Orders   Cytology - PAP   Breast screening       Relevant Orders   MM 3D SCREEN BREAST BILATERAL   Thyroid disorder screening       Relevant Orders   CMP14+LP+TP+TSH+CBC/Plt   Lipid screening       Relevant Orders   CMP14+LP+TP+TSH+CBC/Plt   Screening for diabetes mellitus       Relevant Orders   CMP14+LP+TP+TSH+CBC/Plt   Encounter for vitamin deficiency screening  Relevant Orders   Vitamin D (25 hydroxy)      1) Mammogram - recommend yearly screening mammogram.  Mammogram Is up to date  2) STI screening  was notoffered and therefore not obtained  3) ASCCP guidelines and rational discussed.  Patient opts for every 3 years screening interval  4) Osteoporosis  - per USPTF routine screening DEXA at age 35  5) Routine healthcare maintenance including cholesterol, diabetes screening discussed managed by PCP  6) Colonoscopy UTD Dr. Tiffany Kocher  7) Return in about 1 year (around 05/04/2021) for annual.    Malachy Mood, MD [place], [facility] 05/04/2020, 9:09 AM

## 2020-05-05 LAB — CMP14+LP+TP+TSH+CBC/PLT
ALT: 15 IU/L (ref 0–32)
AST: 19 IU/L (ref 0–40)
Albumin/Globulin Ratio: 1.9 (ref 1.2–2.2)
Albumin: 5 g/dL — ABNORMAL HIGH (ref 3.8–4.9)
Alkaline Phosphatase: 119 IU/L (ref 48–121)
BUN/Creatinine Ratio: 18 (ref 9–23)
BUN: 14 mg/dL (ref 6–24)
Bilirubin Total: 0.3 mg/dL (ref 0.0–1.2)
CO2: 25 mmol/L (ref 20–29)
Calcium: 10.3 mg/dL — ABNORMAL HIGH (ref 8.7–10.2)
Chloride: 101 mmol/L (ref 96–106)
Cholesterol, Total: 215 mg/dL — ABNORMAL HIGH (ref 100–199)
Creatinine, Ser: 0.8 mg/dL (ref 0.57–1.00)
Free Thyroxine Index: 2.3 (ref 1.2–4.9)
GFR calc Af Amer: 93 mL/min/{1.73_m2} (ref >59)
GFR calc non Af Amer: 81 mL/min/{1.73_m2} (ref >59)
Globulin, Total: 2.6 g/dL (ref 1.5–4.5)
Glucose: 87 mg/dL (ref 65–99)
HDL: 65 mg/dL (ref >39)
Hematocrit: 46 % (ref 34.0–46.6)
Hemoglobin: 15.4 g/dL (ref 11.1–15.9)
LDL Chol Calc (NIH): 134 mg/dL — ABNORMAL HIGH (ref 0–99)
LDL/HDL Ratio: 2.1 ratio (ref 0.0–3.2)
MCH: 28.2 pg (ref 26.6–33.0)
MCHC: 33.5 g/dL (ref 31.5–35.7)
MCV: 84 fL (ref 79–97)
Platelets: 429 10*3/uL (ref 150–450)
Potassium: 5.1 mmol/L (ref 3.5–5.2)
RBC: 5.47 x10E6/uL — ABNORMAL HIGH (ref 3.77–5.28)
RDW: 13.3 % (ref 11.7–15.4)
Sodium: 139 mmol/L (ref 134–144)
T3 Uptake Ratio: 28 % (ref 24–39)
T4, Total: 8.2 ug/dL (ref 4.5–12.0)
TSH: 1.26 u[IU]/mL (ref 0.450–4.500)
Total Protein: 7.6 g/dL (ref 6.0–8.5)
Triglycerides: 89 mg/dL (ref 0–149)
VLDL Cholesterol Cal: 16 mg/dL (ref 5–40)
WBC: 7.8 10*3/uL (ref 3.4–10.8)

## 2020-05-05 LAB — VITAMIN D 25 HYDROXY (VIT D DEFICIENCY, FRACTURES): Vit D, 25-Hydroxy: 28.9 ng/mL — ABNORMAL LOW (ref 30.0–100.0)

## 2020-05-08 LAB — CYTOLOGY - PAP
Adequacy: ABSENT
Comment: NEGATIVE
Diagnosis: NEGATIVE
High risk HPV: NEGATIVE

## 2020-09-04 ENCOUNTER — Encounter: Payer: Self-pay | Admitting: Emergency Medicine

## 2020-09-04 ENCOUNTER — Other Ambulatory Visit: Payer: Self-pay

## 2020-09-04 ENCOUNTER — Ambulatory Visit: Payer: Self-pay

## 2020-09-04 ENCOUNTER — Emergency Department
Admission: EM | Admit: 2020-09-04 | Discharge: 2020-09-04 | Disposition: A | Discharge status: Home / Self Care | Payer: 59 | Attending: Student in an Organized Health Care Education/Training Program | Admitting: Student in an Organized Health Care Education/Training Program

## 2020-09-04 ENCOUNTER — Emergency Department: Payer: 59

## 2020-09-04 DIAGNOSIS — R5383 Other fatigue: Secondary | ICD-10-CM | POA: Diagnosis not present

## 2020-09-04 DIAGNOSIS — R002 Palpitations: Secondary | ICD-10-CM | POA: Diagnosis not present

## 2020-09-04 DIAGNOSIS — R5381 Other malaise: Secondary | ICD-10-CM | POA: Insufficient documentation

## 2020-09-04 DIAGNOSIS — R531 Weakness: Secondary | ICD-10-CM | POA: Insufficient documentation

## 2020-09-04 LAB — HEPATIC FUNCTION PANEL
ALT: 20 U/L (ref 0–44)
AST: 22 U/L (ref 15–41)
Albumin: 4.8 g/dL (ref 3.5–5.0)
Alkaline Phosphatase: 77 U/L (ref 38–126)
Bilirubin, Direct: <0.1 mg/dL (ref 0.0–0.2)
Total Bilirubin: 0.7 mg/dL (ref 0.3–1.2)
Total Protein: 8.1 g/dL (ref 6.5–8.1)

## 2020-09-04 LAB — CBC
HCT: 46.3 % — ABNORMAL HIGH (ref 36.0–46.0)
Hemoglobin: 15 g/dL (ref 12.0–15.0)
MCH: 27.8 pg (ref 26.0–34.0)
MCHC: 32.4 g/dL (ref 30.0–36.0)
MCV: 85.9 fL (ref 80.0–100.0)
Platelets: 389 10*3/uL (ref 150–400)
RBC: 5.39 MIL/uL — ABNORMAL HIGH (ref 3.87–5.11)
RDW: 13.3 % (ref 11.5–15.5)
WBC: 8.1 10*3/uL (ref 4.0–10.5)
nRBC: 0 % (ref 0.0–0.2)

## 2020-09-04 LAB — TROPONIN I (HIGH SENSITIVITY)
Troponin I (High Sensitivity): <2 ng/L (ref <18)
Troponin I (High Sensitivity): <2 ng/L (ref <18)

## 2020-09-04 LAB — BASIC METABOLIC PANEL
Anion gap: 9 (ref 5–15)
BUN: 15 mg/dL (ref 6–20)
CO2: 26 mmol/L (ref 22–32)
Calcium: 9.7 mg/dL (ref 8.9–10.3)
Chloride: 101 mmol/L (ref 98–111)
Creatinine, Ser: 0.78 mg/dL (ref 0.44–1.00)
GFR, Estimated: >60 mL/min (ref >60)
Glucose, Bld: 91 mg/dL (ref 70–99)
Potassium: 4.4 mmol/L (ref 3.5–5.1)
Sodium: 136 mmol/L (ref 135–145)

## 2020-09-04 LAB — MAGNESIUM: Magnesium: 2.4 mg/dL (ref 1.7–2.4)

## 2020-09-04 LAB — TSH: TSH: 1.326 u[IU]/mL (ref 0.350–4.500)

## 2020-09-04 LAB — FIBRIN DERIVATIVES D-DIMER (ARMC ONLY): Fibrin derivatives D-dimer (ARMC): 272.77 ng/mL (FEU) (ref 0.00–499.00)

## 2020-09-04 NOTE — ED Notes (Signed)
Warm blanket to patient

## 2020-09-04 NOTE — Telephone Encounter (Signed)
Dr. Caryn Section, please review. Patient wants to be seen in the office with these symptoms. Thanks!

## 2020-09-04 NOTE — ED Triage Notes (Signed)
Pt reports Sunday started with intermittent episodes of rapid heart rate and shortness of breath. Pt reports some discomfort in her right shoulder as well but reports an injury to that shoulder as well

## 2020-09-04 NOTE — Discharge Instructions (Addendum)
Please follow up with Dr. Caryn Section.  Your workup here in the ER is reassuring.  Return for any additional questions or concerns.

## 2020-09-04 NOTE — Telephone Encounter (Signed)
Pt. Reports she started feeling bad Sunday - right sided chest pain that radiates into her shoulder and back. Comes and goes. Pain is 2-3/10. Feels like her heart is racing at times. Shortness of breath that comes and goes and numbness in both arms that comes and goes. Has increased fatigue. BP yesterday 140/97 and 160/84. No chest pain at present. Refuses to go to ED. Wants to be seen in the practice. Spoke with Rochelle in the practice who agrees with ED. Pt. Still refuses. Please advise pt.  Reason for Disposition  Pain also in shoulder(s) or arm(s) or jaw (Exception: pain is clearly made worse by movement)  Answer Assessment - Initial Assessment Questions 1. LOCATION: "Where does it hurt?"       Right side 2. RADIATION: "Does the pain go anywhere else?" (e.g., into neck, jaw, arms, back)     Shoulder 3. ONSET: "When did the chest pain begin?" (Minutes, hours or days)      Sunday - fatigue, nausea 4. PATTERN "Does the pain come and go, or has it been constant since it started?"  "Does it get worse with exertion?"      Comes and goes 5. DURATION: "How long does it last" (e.g., seconds, minutes, hours)     20  minutes 6. SEVERITY: "How bad is the pain?"  (e.g., Scale 1-10; mild, moderate, or severe)    - MILD (1-3): doesn't interfere with normal activities     - MODERATE (4-7): interferes with normal activities or awakens from sleep    - SEVERE (8-10): excruciating pain, unable to do any normal activities       2-3 7. CARDIAC RISK FACTORS: "Do you have any history of heart problems or risk factors for heart disease?" (e.g., angina, prior heart attack; diabetes, high blood pressure, high cholesterol, smoker, or strong family history of heart disease)     No 8. PULMONARY RISK FACTORS: "Do you have any history of lung disease?"  (e.g., blood clots in lung, asthma, emphysema, birth control pills)     No 9. CAUSE: "What do you think is causing the chest pain?"     Unsure 10. OTHER SYMPTOMS:  "Do you have any other symptoms?" (e.g., dizziness, nausea, vomiting, sweating, fever, difficulty breathing, cough)       Nausea, fatigue, shortness of breath 11. PREGNANCY: "Is there any chance you are pregnant?" "When was your last menstrual period?"       No  Protocols used: CHEST PAIN-A-AH

## 2020-09-04 NOTE — Telephone Encounter (Signed)
Patient is currently being seen at ED.

## 2020-09-04 NOTE — ED Provider Notes (Signed)
[facility] Emergency Department Provider Note    First MD Initiated Contact with Patient 09/04/20 1130     (approximate)  I have reviewed the triage vital signs and the nursing notes.   HISTORY  Chief Complaint Tachycardia, Shortness of Breath, and Shoulder Pain    HPI Sarah Cox is a 59 y.o. female with the below listed past medical history presents to the ER for evaluation of feeling palpitations as well as generalized malaise and fatigue.  States that she has been having some trouble sleeping over the past few days used to take Ambien and that would help but states last night tossing and turning and could not go to sleep.  Also having some palpitations where she feels like her heart is racing.  Denies any chest pain or discomfort with deep inspiration.  She feels rundown.  States that they had a family gathering over the weekend and has felt rundown since then.  She denies any nausea or vomiting.  Does increase increased stress at work work and financial stressors at home.   Past Medical History:  Diagnosis Date  . Arthritis    Family History  Problem Relation Age of Onset  . Osteoporosis Mother   . Breast cancer Mother 23  . Anemia Mother   . Colon cancer Father    Past Surgical History:  Procedure Laterality Date  . CESAREAN SECTION  1992  . LYMPHADENECTOMY  2007   FACE  . TUBAL LIGATION  1997  . WISDOM TOOTH EXTRACTION     Patient Active Problem List   Diagnosis Date Noted  . Insomnia 11/07/2015  . Blood pressure elevated 05/17/2015  . Acid reflux 05/17/2015  . Awareness of heartbeats 05/17/2015  . 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 Feeling stressed out 05/17/2015  . 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 Authorizing Provider  fluticasone (FLONASE) 50 MCG/ACT nasal spray TAKE 2 SPRAYS INTO BOTH NOSTRILS DAILY 02/04/20   Birdie Sons, MD    hydrocortisone (ANUSOL-HC) 25 MG suppository Place 1 suppository (25 mg total) rectally 2 (two) times daily as needed. 06/27/19   Malachy Mood, MD  ibuprofen (ADVIL,MOTRIN) 200 MG tablet Take 200 mg by mouth every 6 (six) hours as needed.    [provider]  Loratadine-Pseudoephedrine (CLARITIN-D 24 HOUR PO) Take 1 tablet by mouth daily.    [provider]  MULTIPLE VITAMIN PO Take 1 tablet by mouth daily.    [provider]  zolpidem (AMBIEN) 5 MG tablet TAKE ONE TABLET BY MOUTH AT BEDTIME AS NEEDED FOR SLEEP 03/07/20   Malachy Mood, MD    Allergies Ciprofloxacin and Sulfa antibiotics    Social History Social History   Tobacco Use  . Smoking status: Never Smoker  . Smokeless tobacco: Never Used  Vaping Use  . Vaping Use: Never used  Substance Use Topics  . Alcohol use: Yes    Alcohol/week: 0.0 standard drinks    Comment: MODERATE USE  . Drug use: No    Review of Systems Patient denies headaches, rhinorrhea, blurry vision, numbness, shortness of breath, chest pain, edema, cough, abdominal pain, nausea, vomiting, diarrhea, dysuria, fevers, rashes or hallucinations unless otherwise stated above in HPI. ____________________________________________   PHYSICAL EXAM:  VITAL SIGNS: Vitals:   09/04/20 1135 09/04/20 1200  BP: (!) 159/102 (!) 137/96  Pulse: 78 78  Resp: 18 19  Temp:  SpO2: 100% 100%    Constitutional: Alert and oriented.  Eyes: Conjunctivae are normal.  Head: Atraumatic. Nose: No congestion/rhinnorhea. Mouth/Throat: Mucous membranes are moist.   Neck: No stridor. Painless ROM.  Cardiovascular: Normal rate, regular rhythm. Grossly normal heart sounds.  Good peripheral circulation. Respiratory: Normal respiratory effort.  No retractions. Lungs CTAB. Gastrointestinal: Soft and nontender. No distention. No abdominal bruits. No CVA tenderness. Genitourinary:  Musculoskeletal: No lower extremity tenderness nor edema.  No  joint effusions. Neurologic:  CN- intact.  No facial droop, Normal FNF.  Normal heel to shin.  Sensation intact bilaterally. Normal speech and language. No gross focal neurologic deficits are appreciated. No gait instability. Skin:  Skin is warm, dry and intact. No rash noted. Psychiatric: Mood and affect are normal. Speech and behavior are normal.  ____________________________________________   LABS (all labs ordered are listed, but only abnormal results are displayed)  Results for orders placed or performed during the hospital encounter of 09/04/20 (from the past 24 hour(s))  Basic metabolic panel     Status: None   Collection Time: 09/04/20  9:36 AM  Result Value Ref Range   Sodium 136 135 - 145 mmol/L   Potassium 4.4 3.5 - 5.1 mmol/L   Chloride 101 98 - 111 mmol/L   CO2 26 22 - 32 mmol/L   Glucose, Bld 91 70 - 99 mg/dL   BUN 15 6 - 20 mg/dL   Creatinine, Ser 0.78 0.44 - 1.00 mg/dL   Calcium 9.7 8.9 - 10.3 mg/dL   GFR, Estimated >60 >60 mL/min   Anion gap 9 5 - 15  CBC     Status: Abnormal   Collection Time: 09/04/20  9:36 AM  Result Value Ref Range   WBC 8.1 4.0 - 10.5 K/uL   RBC 5.39 (H) 3.87 - 5.11 MIL/uL   Hemoglobin 15.0 12.0 - 15.0 g/dL   HCT 46.3 (H) 36 - 46 %   MCV 85.9 80.0 - 100.0 fL   MCH 27.8 26.0 - 34.0 pg   MCHC 32.4 30.0 - 36.0 g/dL   RDW 13.3 11.5 - 15.5 %   Platelets 389 150 - 400 K/uL   nRBC 0.0 0.0 - 0.2 %  Troponin I (High Sensitivity)     Status: None   Collection Time: 09/04/20  9:36 AM  Result Value Ref Range   Troponin I (High Sensitivity) <2 <18 ng/L  Troponin I (High Sensitivity)     Status: None   Collection Time: 09/04/20 11:40 AM  Result Value Ref Range   Troponin I (High Sensitivity) <2 <18 ng/L  TSH     Status: None   Collection Time: 09/04/20 11:40 AM  Result Value Ref Range   TSH 1.326 0.350 - 4.500 uIU/mL  Hepatic function panel     Status: None   Collection Time: 09/04/20 11:40 AM  Result Value Ref Range   Total Protein 8.1 6.5  - 8.1 g/dL   Albumin 4.8 3.5 - 5.0 g/dL   AST 22 15 - 41 U/L   ALT 20 0 - 44 U/L   Alkaline Phosphatase 77 38 - 126 U/L   Total Bilirubin 0.7 0.3 - 1.2 mg/dL   Bilirubin, Direct <0.1 0.0 - 0.2 mg/dL   Indirect Bilirubin NOT CALCULATED 0.3 - 0.9 mg/dL  Magnesium     Status: None   Collection Time: 09/04/20 11:40 AM  Result Value Ref Range   Magnesium 2.4 1.7 - 2.4 mg/dL  Fibrin derivatives D-Dimer ([facility] only)  Status: None   Collection Time: 09/04/20 11:40 AM  Result Value Ref Range   Fibrin derivatives D-dimer ([facility]) 272.77 0.00 - 499.00 ng/mL (FEU)   ____________________________________________  EKG My review and personal interpretation at Time: 9:28   Indication: weakness  Rate: 70  Rhythm: sinus Axis: normal Other: normal intervals, no stemi, no depressions ____________________________________________  RADIOLOGY  I personally reviewed all radiographic images ordered to evaluate for the above acute complaints and reviewed radiology reports and findings.  These findings were personally discussed with the patient.  Please see medical record for radiology report.  ____________________________________________   PROCEDURES  Procedure(s) performed:  Procedures    Critical Care performed: no ____________________________________________   INITIAL IMPRESSION / ASSESSMENT AND PLAN / ED COURSE  Pertinent labs & imaging results that were available during my care of the patient were reviewed by me and considered in my medical decision making (see chart for details).   DDX: Dysrhythmia, electrolyte abnormality, anemia, hypothyroid, CHF, PE, ACS, radiculopathy  ANALISIA KINGSFORD is a 59 y.o. who presents to the ED with presentation as described above.  Patient presentation as described above she is afebrile hemodynamically stable.  Does not have any focal deficits.  Is not consistent with CVA.  Doubt acute cord injury given lack of injury no focal deficits.  EKG is nonischemic.   Troponin normal.  Will observe on telemetry check TSH as well as magnesium.  Clinical Course as of Sep 05 1303  Tue Sep 04, 2020  1256 Patient reassessed.  Her blood work is reassuring.  Her exam is also reassuring no focal deficits.  We discussed further work-up including MRI to evaluate for her numbness.  As she does not have any focal motor weakness has a known injury I suspect that she might have just had some overuse injury from the weekend.  Patient states that she would prefer to discuss further diagnostic testing with her PCP.  As she is otherwise clinically well-appearing without any objective findings she does appear appropriate for outpatient follow-up.   [PR]    Clinical Course User Index [PR] Merlyn Lot, MD    The patient was evaluated in Emergency Department today for the symptoms described in the history of present illness. He/she was evaluated in the context of the global COVID-19 pandemic, which necessitated consideration that the patient might be at risk for infection with the SARS-CoV-2 virus that causes COVID-19. Institutional protocols and algorithms that pertain to the evaluation of patients at risk for COVID-19 are in a state of rapid change based on information released by regulatory bodies including the CDC and federal and state organizations. These policies and algorithms were followed during the patient's care in the ED.  As part of my medical decision making, I reviewed the following data within the [place] notes reviewed and incorporated, Labs reviewed, notes from prior ED visits and [place] Controlled Substance Database   ____________________________________________   FINAL CLINICAL IMPRESSION(S) / ED DIAGNOSES  Final diagnoses:  Palpitations  Weakness      NEW MEDICATIONS STARTED DURING THIS VISIT:  New Prescriptions   No medications on file     Note:  This document was prepared using Dragon voice recognition software and  may include unintentional dictation errors.    Merlyn Lot, MD 09/04/20 1304

## 2020-09-10 ENCOUNTER — Other Ambulatory Visit: Payer: Self-pay | Admitting: Obstetrics and Gynecology

## 2020-09-10 NOTE — Telephone Encounter (Signed)
Please advise 

## 2020-10-15 ENCOUNTER — Other Ambulatory Visit: Payer: Self-pay | Admitting: Obstetrics and Gynecology

## 2020-10-15 NOTE — Telephone Encounter (Signed)
Last filled 09/11/20 with no refills. Please advise.

## 2020-11-02 LAB — HM COLONOSCOPY

## 2020-11-20 ENCOUNTER — Other Ambulatory Visit: Payer: Self-pay | Admitting: Obstetrics and Gynecology

## 2020-12-25 ENCOUNTER — Other Ambulatory Visit: Payer: Self-pay | Admitting: Obstetrics and Gynecology

## 2020-12-26 ENCOUNTER — Telehealth: Payer: Self-pay

## 2020-12-26 NOTE — Telephone Encounter (Signed)
Copied from [place] [id number]. Topic: Appointment Scheduling - Scheduling Inquiry for Clinic >> Dec 26, 2020 12:40 PM Sarah Cox wrote: Reason for CRM: PT did not pass DT, thinks she has shingles, wanted to come into office. During DT states allergies are bothering her , nasal congestion, runny eyes, but back is hurting and has a rash. States allergies are a part of her history. FU after lunch  for scheduling. [phone]

## 2020-12-26 NOTE — Progress Notes (Signed)
MyChart Video Visit    Virtual Visit via Video Note   This visit type was conducted due to national recommendations for restrictions regarding the COVID-19 Pandemic (e.g. social distancing) in an effort to limit this patient's exposure and mitigate transmission in our community. This patient is at least at moderate risk for complications without adequate follow up. This format is felt to be most appropriate for this patient at this time. Physical exam was limited by quality of the video and audio technology used for the visit.   Patient location: Home Provider location: Office   I discussed the limitations of evaluation and management by telemedicine and the availability of in person appointments. The patient expressed understanding and agreed to proceed.  Patient: Sarah Cox   DOB: 1961-03-05   60 y.o. Female  MRN: [id number] Visit Date: 12/27/2020  Today's healthcare provider: Trinna Post, PA-C   Chief Complaint  Patient presents with  . Rash   Subjective    HPI   Rash: Patient complains of rash involving the back. Rash started 5 days ago. Appearance of rash at onset: Color of lesion(s): pink, Other appearance: dime-sized and crusted over. Rash has not changed over time Initial distribution: back.  Discomfort associated with rash: is painful and itchy.  Associated symptoms: myalgia. Denies: fever, headache, nausea and vomiting. Patient has not had previous evaluation of rash. Patient has not had previous treatment.  Response to treatment: n/a. Patient has had contacts with similar rash. Patient has not identified precipitant. Patient has not had new exposures (soaps, lotions, laundry detergents, foods, medications, plants, insects or animals.)  Spot on right side of spine, itchy, right side of back hurts where spot is, burning sensation. She has had this before.    Medications: Outpatient Medications Prior to Visit  Medication Sig  . fluticasone (FLONASE) 50 MCG/ACT  nasal spray TAKE 2 SPRAYS INTO BOTH NOSTRILS DAILY  . hydrocortisone (ANUSOL-HC) 25 MG suppository Place 1 suppository (25 mg total) rectally 2 (two) times daily as needed.  Marland Kitchen ibuprofen (ADVIL,MOTRIN) 200 MG tablet Take 200 mg by mouth every 6 (six) hours as needed.  . Loratadine-Pseudoephedrine (CLARITIN-D 24 HOUR PO) Take 1 tablet by mouth daily.  . MULTIPLE VITAMIN PO Take 1 tablet by mouth daily.  Marland Kitchen zolpidem (AMBIEN) 5 MG tablet TAKE ONE TABLET BY MOUTH AT BEDTIME AS NEEDED FOR SLEEP   No facility-administered medications prior to visit.    Review of Systems    Objective    LMP 07/07/2017 (Exact Date)    Physical Exam Constitutional:      Appearance: Normal appearance.  Skin:    General: Skin is warm and dry.     Findings: Rash present.       Neurological:     Mental Status: She is alert. Mental status is at baseline.  Psychiatric:        Mood and Affect: Mood normal.        Behavior: Behavior normal.        Assessment & Plan    1. Rash   2. Herpes zoster without complication  - valACYclovir (VALTREX) 1000 MG tablet; Take 1 tablet (1,000 mg total) by mouth 3 (three) times daily for 7 days.  Dispense: 21 tablet; Refill: 0   Return if symptoms worsen or fail to improve.     I discussed the assessment and treatment plan with the patient. The patient was provided an opportunity to ask questions and all were answered. The patient agreed  with the plan and demonstrated an understanding of the instructions.   The patient was advised to call back or seek an in-person evaluation if the symptoms worsen or if the condition fails to improve as anticipated.   ITrinna Post, PA-C, have reviewed all documentation for this visit. The documentation on 12/28/20 for the exam, diagnosis, procedures, and orders are all accurate and complete.  The entirety of the information documented in the History of Present Illness, Review of Systems and Physical Exam were personally  obtained by me. Portions of this information were initially documented by [facility] and reviewed by me for thoroughness and accuracy.     Paulene Floor [facility] [phone] (phone) [phone] (fax)  [place]

## 2020-12-26 NOTE — Telephone Encounter (Signed)
Virtual visit scheduled for tomorrow 12/27/2020 with Carles Collet at 8:40am.

## 2020-12-27 ENCOUNTER — Other Ambulatory Visit: Payer: Self-pay

## 2020-12-27 ENCOUNTER — Telehealth (INDEPENDENT_AMBULATORY_CARE_PROVIDER_SITE_OTHER): Payer: 59 | Admitting: Physician Assistant

## 2020-12-27 DIAGNOSIS — R21 Rash and other nonspecific skin eruption: Secondary | ICD-10-CM

## 2020-12-27 DIAGNOSIS — B029 Zoster without complications: Secondary | ICD-10-CM | POA: Diagnosis not present

## 2020-12-27 MED ORDER — VALACYCLOVIR HCL 1 G PO TABS: 1000.0000 mg | ORAL_TABLET | Freq: Three times a day (TID) | ORAL | 0 refills | Status: DC

## 2020-12-27 NOTE — Patient Instructions (Signed)
Murray &amp; Nadel's Textbook of Respiratory Medicine (7th ed., pp. 508-520). Elsevier.">  Postnasal Drip Postnasal drip is the feeling of mucus going down the back of your throat. Mucus is a slimy substance that moistens and cleans your nose and throat, as well as the air pockets in face bones near your forehead and cheeks (sinuses). Small amounts of mucus pass from your nose and sinuses down the back of your throat all the time. This is normal. When you produce too much mucus or the mucus gets too thick, you can feel it. Some common causes of postnasal drip include:  Having more mucus because of: ? A cold or the flu. ? Allergies. ? Cold air. ? Certain medicines.  Having more mucus that is thicker because of: ? A sinus or nasal infection. ? Dry air. ? A food allergy. Follow these instructions at home: Relieving discomfort  Gargle with a salt-water mixture 3-4 times a day or as needed. To make a salt-water mixture, completely dissolve -1 tsp of salt in 1 cup of warm water.  If the air in your home is dry, use a humidifier to add moisture to the air.  Use a saline spray or container (neti pot) to flush out the nose (nasal irrigation). These methods can help clear away mucus and keep the nasal passages moist.   General instructions  Take over-the-counter and prescription medicines only as told by your health care provider.  Follow instructions from your health care provider about eating or drinking restrictions. You may need to avoid caffeine.  Avoid things that you know you are allergic to (allergens), like dust, mold, pollen, pets, or certain foods.  Drink enough fluid to keep your urine pale yellow.  Keep all follow-up visits as told by your health care provider. This is important. Contact a health care provider if:  You have a fever.  You have a sore throat.  You have difficulty swallowing.  You have headache.  You have sinus pain.  You have a cough that does not go  away.  The mucus from your nose becomes thick and is green or yellow in color.  You have cold or flu symptoms that last more than 10 days. Summary  Postnasal drip is the feeling of mucus going down the back of your throat.  If your health care provider approves, use nasal irrigation or a nasal spray 2?4 times a day.  Avoid things that you know you are allergic to (allergens), like dust, mold, pollen, pets, or certain foods. This information is not intended to replace advice given to you by your health care provider. Make sure you discuss any questions you have with your health care provider. Document Revised: 07/10/2020 Document Reviewed: 07/10/2020 Elsevier Patient Education  2021 Elsevier Inc.  

## 2021-01-28 ENCOUNTER — Other Ambulatory Visit: Payer: Self-pay | Admitting: Obstetrics and Gynecology

## 2021-03-04 ENCOUNTER — Telehealth: Payer: Self-pay

## 2021-03-04 ENCOUNTER — Other Ambulatory Visit: Payer: Self-pay | Admitting: Obstetrics and Gynecology

## 2021-03-04 DIAGNOSIS — B029 Zoster without complications: Secondary | ICD-10-CM

## 2021-03-04 NOTE — Telephone Encounter (Signed)
[facility] faxed refill request for the following medications:  valACYclovir (VALTREX) 1000 MG tablet   Please advise.

## 2021-03-05 MED ORDER — VALACYCLOVIR HCL 1 G PO TABS: 1000.0000 mg | ORAL_TABLET | Freq: Three times a day (TID) | ORAL | 3 refills | Status: DC

## 2021-03-05 NOTE — Telephone Encounter (Signed)
Medication last filled by Fabio Bering on 12/31/20 will refill script as following, okay to approve refill? KW  valACYclovir (VALTREX) 1000 MG tablet; Take 1 tablet (1,000 mg total) by mouth 3 (three) times daily for 7 days.  Dispense: 21 tablet; Refill: 0

## 2021-04-08 ENCOUNTER — Other Ambulatory Visit: Payer: Self-pay | Admitting: Obstetrics and Gynecology

## 2021-04-09 ENCOUNTER — Telehealth: Payer: Self-pay

## 2021-04-09 NOTE — Telephone Encounter (Signed)
Pt calling for refill of Lorrin Mais; it was denied yesterday; pt has appt scheduled for 7/29th; if not refilling what is recommended otc for her; meletonin does not work; needs something to get a good nights sleep.  [phone]  left msg courtesy call AMS on call; msg will be sent to him.

## 2021-04-09 NOTE — Telephone Encounter (Signed)
Unisom, we can talk about alternatives at her visit but Lorrin Mais is not a good long term option

## 2021-04-10 NOTE — Telephone Encounter (Signed)
Pt aware.

## 2021-04-22 ENCOUNTER — Other Ambulatory Visit: Payer: Self-pay | Admitting: Obstetrics and Gynecology

## 2021-05-10 ENCOUNTER — Encounter: Payer: Self-pay | Admitting: Obstetrics and Gynecology

## 2021-05-10 ENCOUNTER — Ambulatory Visit (INDEPENDENT_AMBULATORY_CARE_PROVIDER_SITE_OTHER): Payer: 59 | Admitting: Obstetrics and Gynecology

## 2021-05-10 ENCOUNTER — Other Ambulatory Visit: Payer: Self-pay

## 2021-05-10 VITALS — BP 106/60 | Ht 63.0 in | Wt 152.0 lb

## 2021-05-10 DIAGNOSIS — Z01419 Encounter for gynecological examination (general) (routine) without abnormal findings: Secondary | ICD-10-CM

## 2021-05-10 DIAGNOSIS — Z1231 Encounter for screening mammogram for malignant neoplasm of breast: Secondary | ICD-10-CM

## 2021-05-10 DIAGNOSIS — Z1239 Encounter for other screening for malignant neoplasm of breast: Secondary | ICD-10-CM

## 2021-05-10 NOTE — Patient Instructions (Signed)
[facility] [address] [place] [address]  [facility] [place]  [address] [place] [address]  Phone: [phone]  

## 2021-05-10 NOTE — Progress Notes (Signed)
Gynecology Annual Exam  PCP: Birdie Sons, MD  Chief Complaint:  Chief Complaint  Patient presents with   Gynecologic Exam    History of Present Illness: Patient is a 60 y.o. G2P2 presents for annual exam. The patient has no complaints today.   LMP: Patient's last menstrual period was 07/07/2017 (exact date). No PMB.  Got engaged and son got engaged recently as well.   The patient is sexually active. She currently uses post menopausal status for contraception. She denies dyspareunia.  The patient does perform self breast exams.  There is no notable family history of breast or ovarian cancer in her family (Mom in 55's).  The patient wears seatbelts: no.   The patient has regular exercise: yes but limited some secondary to sciatica    The patient denies current symptoms of depression.    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 Contraception: post menopausal status Last Pap: Results were: 05/04/2020 NILM and HR HPV negative  06/10/2018 NIL and HR HPV negative  Last mammogram: 08/11/2019 Results were: BI-RAD I Endometrial biopsy 07/16/2017 Disorderly proliferative endometrium Ultrasound 07/17/2017 EMS of 5.69m, 3 intramural fibroids largest 60 x440m   Obstetric History: G2P2  Family History:  Family History  Problem Relation Age of Onset   Osteoporosis Mother    Breast cancer Mother [id number] Anemia  Mother    Colon cancer Father     Social History:  Social History   Socioeconomic History   Marital status: Divorced    Spouse name: Not on file   Number of children: Not on file   Years of education: Not on file   Highest education level: Not on file  Occupational History   Not on file  Tobacco Use   Smoking status: Never   Smokeless tobacco: Never  Vaping Use   Vaping Use: Never used  Substance and Sexual Activity   Alcohol use: Yes    Alcohol/week: 0.0 standard drinks    Comment: MODERATE USE   Drug use: No   Sexual activity: Yes    Birth control/protection: None, Surgical  Other Topics Concern   Not on file  Social History Narrative   Not on file   Social Determinants of Health   Financial Resource Strain: Not on file  Food Insecurity: Not on file  Transportation Needs: Not on file  Physical Activity: Not on file  Stress: Not on file  Social Connections: Not on file  Intimate Partner Violence: Not on file    Allergies:  Allergies  Allergen Reactions   Ciprofloxacin Rash   Sulfa Antibiotics Rash    Medications: Prior to Admission medications   Medication Sig Start Date End Date Taking? Authorizing Provider  fluticasone (FLONASE) 50 MCG/ACT nasal spray TAKE 2 SPRAYS INTO BOTH NOSTRILS DAILY 02/04/20  Yes FiBirdie SonsMD  ibuprofen (ADVIL,MOTRIN) 200 MG tablet Take 200 mg  by mouth every 6 (six) hours as needed.   Yes [provider]  Loratadine-Pseudoephedrine (CLARITIN-D 24 HOUR PO) Take 1 tablet by mouth daily.   Yes [provider]  valACYclovir (VALTREX) 500 MG tablet TAKE ONE TABLET TWICE A DAY FOR 3 DAYS 04/22/21  Yes Malachy Mood, MD  zolpidem (AMBIEN) 5 MG tablet TAKE ONE TABLET AT BEDTIME IF NEEDED FORSLEEP Patient not taking: Reported on 05/10/2021 03/05/21   Malachy Mood, MD    Physical Exam Vitals: Blood pressure 106/60, height '5\' 3"'$  (1.6 m), weight 152 lb (68.9 kg), last menstrual period 07/07/2017.  General:  NAD HEENT: normocephalic, anicteric Thyroid: no enlargement, no palpable nodules Pulmonary: No increased work of breathing, CTAB Cardiovascular: RRR, distal pulses 2+ Breast: Breast symmetrical, no tenderness, no palpable nodules or masses, no skin or nipple retraction present, no nipple discharge.  No axillary or supraclavicular lymphadenopathy. Abdomen: NABS, soft, non-tender, non-distended.  Umbilicus without lesions.  No hepatomegaly, splenomegaly or masses palpable. No evidence of hernia  Genitourinary:  External: Normal external female genitalia.  Normal urethral meatus, normal Bartholin's and Skene's glands.    Vagina: Normal vaginal mucosa, no evidence of prolapse.    Cervix: Grossly normal in appearance, no bleeding  Uterus: Non-enlarged, mobile, normal contour.  No CMT  Adnexa: ovaries non-enlarged, no adnexal masses  Rectal: deferred  Lymphatic: no evidence of inguinal lymphadenopathy Extremities: no edema, erythema, or tenderness Neurologic: Grossly intact Psychiatric: mood appropriate, affect full  Female chaperone present for pelvic and breast  portions of the physical exam    Assessment: 60 y.o. G2P2 routine annual exam  Plan: Problem List Items Addressed This Visit   None Visit Diagnoses     Encounter for gynecological examination without abnormal finding    -  Primary   Breast screening       Breast cancer screening by mammogram       Relevant Orders   MM 3D SCREEN BREAST BILATERAL       1) Mammogram - recommend yearly screening mammogram.  Mammogram Was ordered today   2) STI screening  was notoffered and therefore not obtained  3) ASCCP guidelines and rational discussed.  Patient opts for every 3 years screening interval  4) Contraception - the patient is currently using  post menopausal status.   5) Colonoscopy -- UTD  6) Routine healthcare maintenance including cholesterol, diabetes screening discussed managed by PCP  7) Return in about 1 year  (around 05/10/2022) for Annual.   Malachy Mood, MD, [place], [facility] 05/10/2021, 8:25 AM

## 2021-05-30 ENCOUNTER — Ambulatory Visit
Admission: RE | Admit: 2021-05-30 | Discharge: 2021-05-30 | Disposition: A | Discharge status: Home / Self Care | Payer: 59 | Source: Ambulatory Visit | Attending: Obstetrics and Gynecology | Admitting: Obstetrics and Gynecology

## 2021-05-30 ENCOUNTER — Other Ambulatory Visit: Payer: Self-pay

## 2021-05-30 DIAGNOSIS — Z1231 Encounter for screening mammogram for malignant neoplasm of breast: Secondary | ICD-10-CM | POA: Diagnosis not present

## 2021-10-25 IMAGING — CR DG CHEST 2V
1 series · 2 of 2 positions shown · non-contrast
Comparison: 12/15/2013

CLINICAL DATA: Shortness of breath

EXAM:
CHEST - 2 VIEW

[Series 1: dg chest 2 view · 0.14mm/px · 2 of 2 slices shown]
[im 1/2]
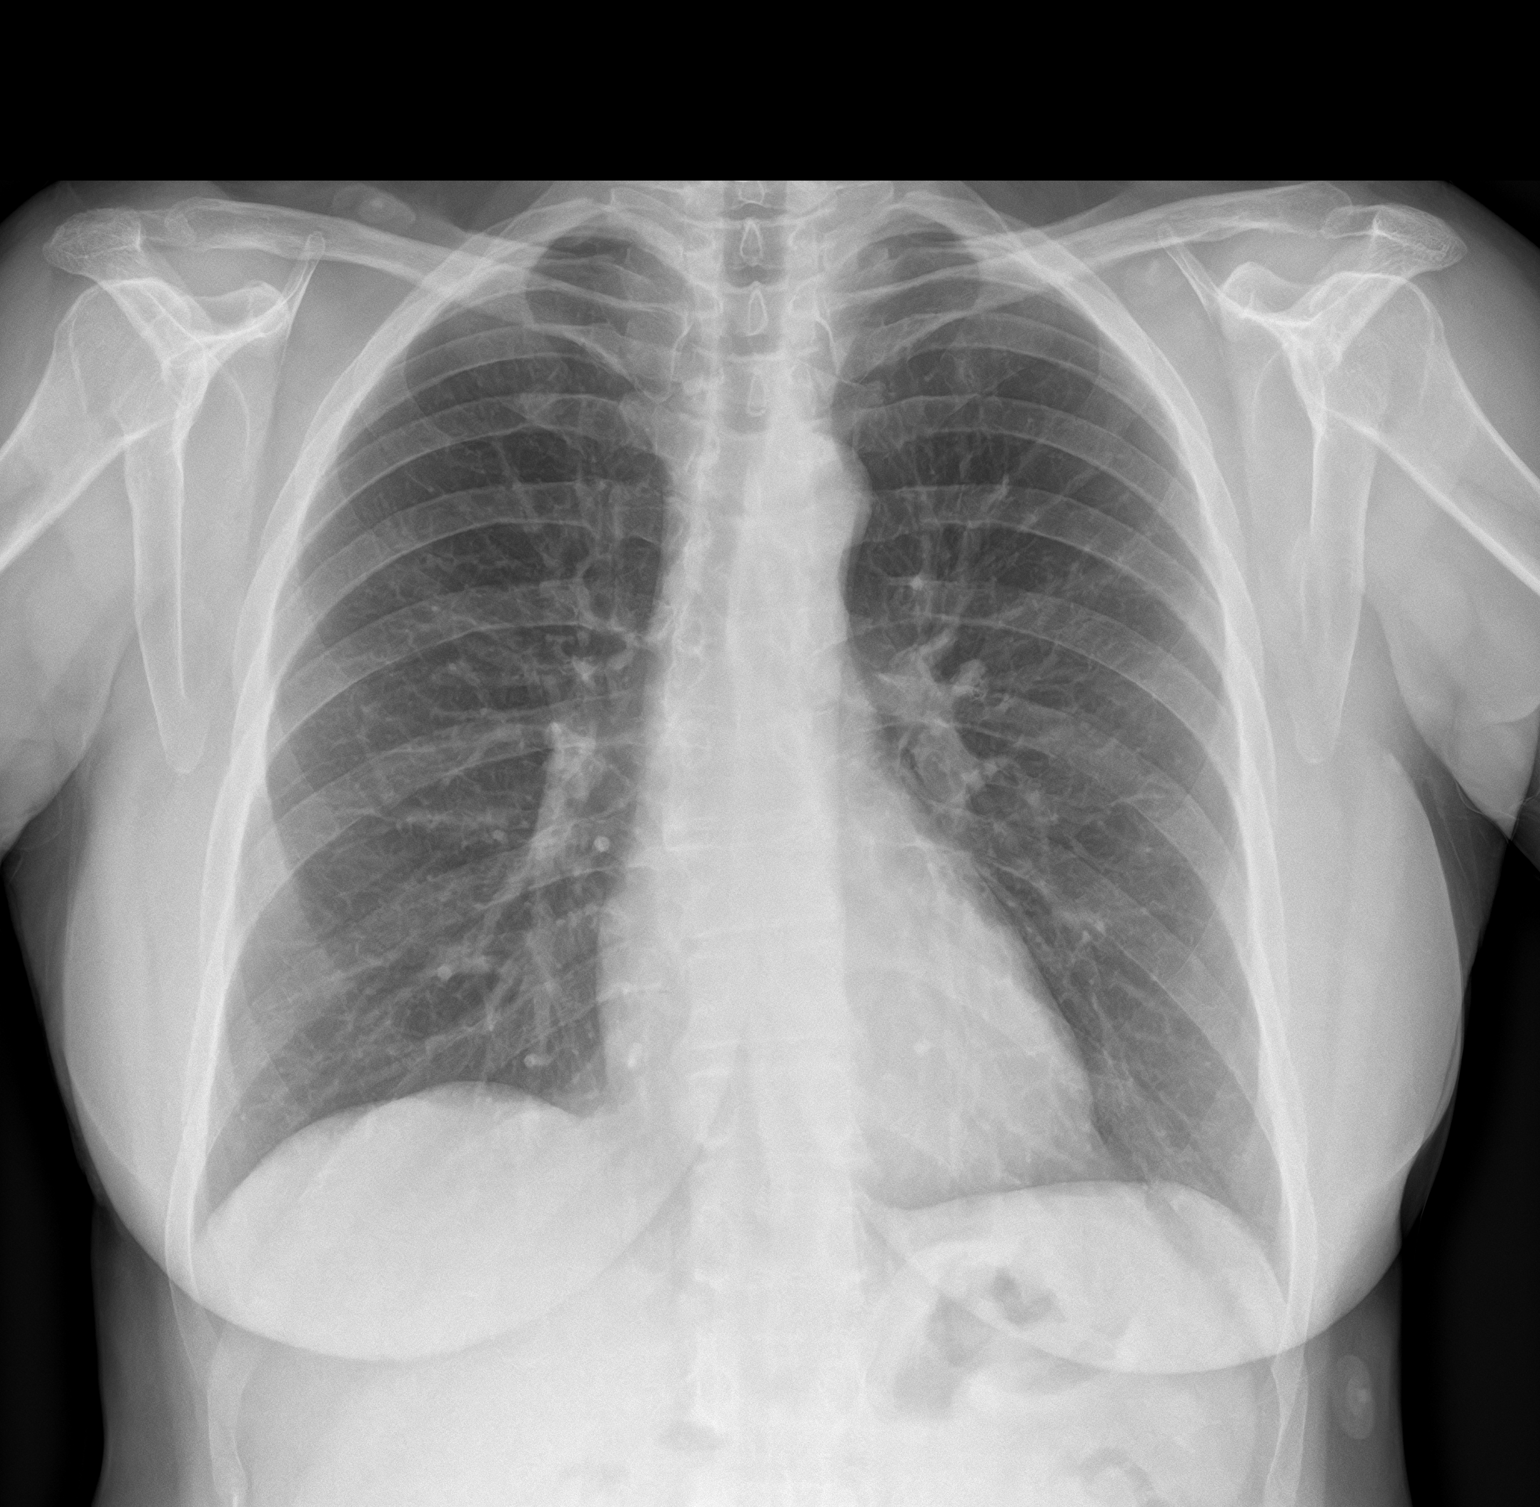
[im 2/2]
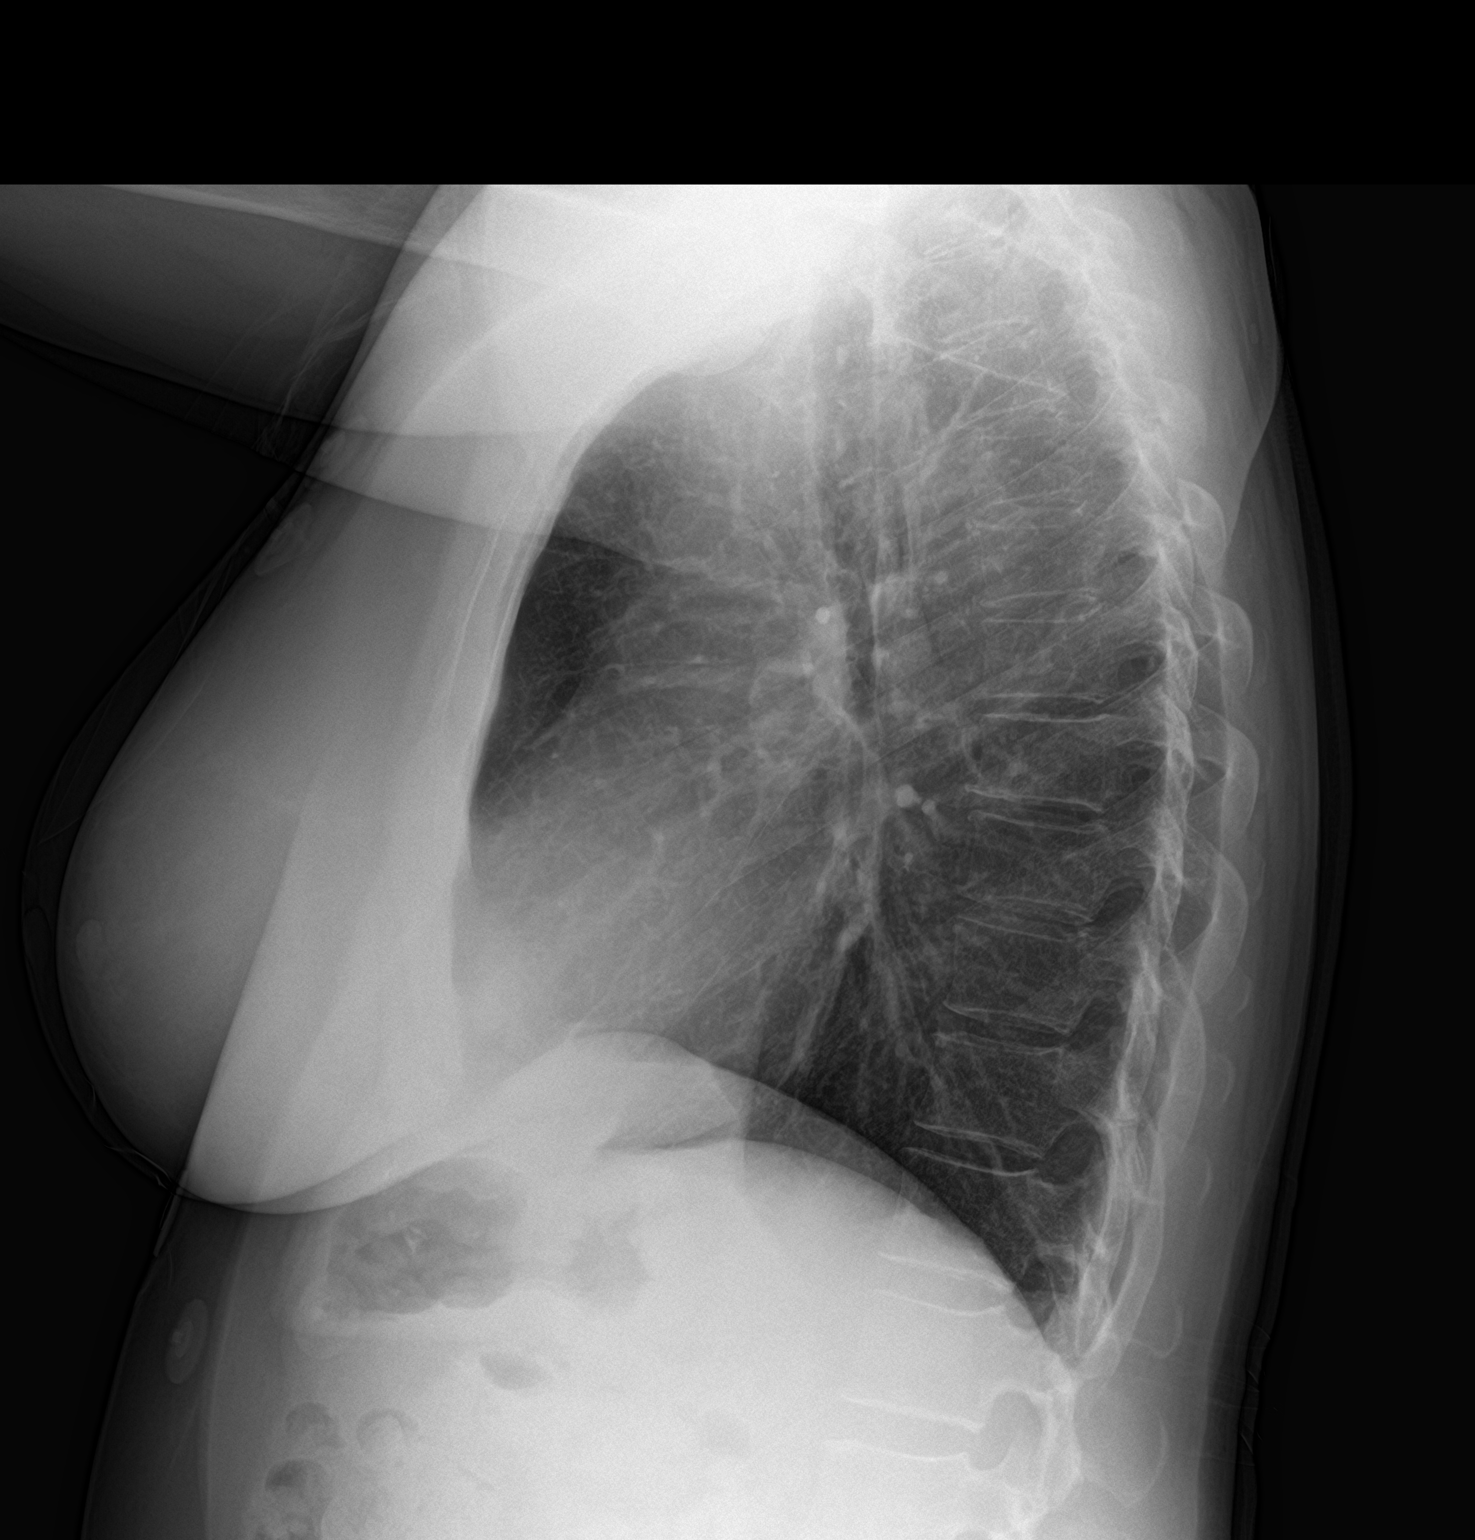

[2 of 2 positions shown; findings below may reference images not displayed]

FINDINGS: The heart size and mediastinal contours are within normal limits.
Both lungs are clear. Lower cervical ACDF.
IMPRESSION: No active cardiopulmonary disease.

## 2022-03-26 ENCOUNTER — Ambulatory Visit: Payer: Self-pay | Admitting: Family Medicine

## 2022-06-27 ENCOUNTER — Ambulatory Visit (INDEPENDENT_AMBULATORY_CARE_PROVIDER_SITE_OTHER): Payer: 59 | Admitting: Family Medicine

## 2022-06-27 ENCOUNTER — Encounter: Payer: Self-pay | Admitting: Family Medicine

## 2022-06-27 VITALS — BP 116/73 | HR 98 | Temp 98.4°F | Resp 16 | Ht 63.0 in | Wt 151.5 lb

## 2022-06-27 DIAGNOSIS — Z136 Encounter for screening for cardiovascular disorders: Secondary | ICD-10-CM

## 2022-06-27 DIAGNOSIS — Z860100 Personal history of colon polyps, unspecified: Secondary | ICD-10-CM | POA: Insufficient documentation

## 2022-06-27 DIAGNOSIS — Z0000 Encounter for general adult medical examination without abnormal findings: Secondary | ICD-10-CM

## 2022-06-27 DIAGNOSIS — Z1159 Encounter for screening for other viral diseases: Secondary | ICD-10-CM | POA: Diagnosis not present

## 2022-06-27 DIAGNOSIS — R5383 Other fatigue: Secondary | ICD-10-CM

## 2022-06-27 DIAGNOSIS — Z23 Encounter for immunization: Secondary | ICD-10-CM

## 2022-06-27 DIAGNOSIS — Z800 Family history of malignant neoplasm of digestive organs: Secondary | ICD-10-CM

## 2022-06-27 DIAGNOSIS — E559 Vitamin D deficiency, unspecified: Secondary | ICD-10-CM | POA: Diagnosis not present

## 2022-06-27 DIAGNOSIS — L989 Disorder of the skin and subcutaneous tissue, unspecified: Secondary | ICD-10-CM

## 2022-06-27 DIAGNOSIS — M545 Low back pain, unspecified: Secondary | ICD-10-CM

## 2022-06-27 DIAGNOSIS — Z1231 Encounter for screening mammogram for malignant neoplasm of breast: Secondary | ICD-10-CM

## 2022-06-27 MED ORDER — VALACYCLOVIR HCL 500 MG PO TABS: ORAL_TABLET | ORAL | 6 refills | Status: DC

## 2022-06-27 NOTE — Patient Instructions (Addendum)
The CDC recommends two doses of Shingrix (the shingles vaccine) separated by 2 to 6 months for adults age 62 years and older. I recommend checking with your insurance plan regarding coverage for this vaccine.  Please call [facility] to schedule your annual routine mammogram [phone]  Please call Dr. Gershon Crane office to schedule a routine skin exam and to take a close look at the pigmented moles on your back

## 2022-06-27 NOTE — Progress Notes (Unsigned)
I,Sulibeya S Dimas,acting as a Education administrator for Lelon Huh, MD.,have documented all relevant documentation on the behalf of Lelon Huh, MD,as directed by  Lelon Huh, MD while in the presence of Lelon Huh, MD.    Complete physical exam   Patient: Sarah Cox   DOB: 09-24-1961   61 y.o. Female  MRN: [id number] Visit Date: 06/27/2022  Today's healthcare provider: Lelon Huh, MD   Chief Complaint  Patient presents with   Annual Exam   Subjective    Sarah Cox is a 61 y.o. female who presents today for a complete physical exam.  She reports consuming a general diet. Home exercise routine includes stretching and walking 3 hrs per week. She generally feels well. She reports sleeping well. She does not have additional problems to discuss today.   She has felt more fatigued lately and has been limited in her activity by low back and left hip for several months.    Past Medical History:  Diagnosis Date   Arthritis    Past Surgical History:  Procedure Laterality Date   CESAREAN SECTION  1992   LYMPHADENECTOMY  2007   FACE   TUBAL LIGATION  1997   WISDOM TOOTH EXTRACTION     Social History   Socioeconomic History   Marital status: Divorced    Spouse name: Not on file   Number of children: Not on file   Years of education: Not on file   Highest education level: Not on file  Occupational History   Not on file  Tobacco Use   Smoking status: Never   Smokeless tobacco: Never  Vaping Use   Vaping Use: Never used  Substance and Sexual Activity   Alcohol use: Yes    Alcohol/week: 0.0 standard drinks of alcohol    Comment: MODERATE USE   Drug use: No   Sexual activity: Yes    Birth control/protection: None, Surgical  Other Topics Concern   Not on file  Social History Narrative   Not on file   Social Determinants of Health   Financial Resource Strain: Not on file  Food Insecurity: Not on file  Transportation Needs: Not on file  Physical Activity: Not on  file  Stress: Not on file  Social Connections: Not on file  Intimate Partner Violence: Not on file   Family Status  Relation Name Status   Mother  Deceased   Father  Deceased   Family History  Problem Relation Age of Onset   Osteoporosis Mother    Breast cancer Mother 48   Anemia Mother    Colon cancer Father    Allergies  Allergen Reactions   Ciprofloxacin Rash   Sulfa Antibiotics Rash    Patient Care Team: Birdie Sons, MD as PCP - General (Family Medicine) Oneta Rack, MD (Dermatology)   Medications: Outpatient Medications Prior to Visit  Medication Sig   fluticasone (FLONASE) 50 MCG/ACT nasal spray TAKE 2 SPRAYS INTO BOTH NOSTRILS DAILY   ibuprofen (ADVIL,MOTRIN) 200 MG tablet Take 200 mg by mouth every 6 (six) hours as needed.   Loratadine-Pseudoephedrine (CLARITIN-D 24 HOUR PO) Take 1 tablet by mouth daily.   valACYclovir (VALTREX) 500 MG tablet TAKE ONE TABLET TWICE A DAY FOR 3 DAYS   No facility-administered medications prior to visit.    Review of Systems  HENT:  Positive for congestion.   Musculoskeletal:  Positive for arthralgias and back pain.  Allergic/Immunologic: Positive for environmental allergies.  All other systems reviewed and are  negative.    Physical Exam    General Appearance:    Well developed, well nourished female. Alert, cooperative, in no acute distress, appears stated age   Head:    Normocephalic, without obvious abnormality, atraumatic  Eyes:    PERRL, conjunctiva/corneas clear, EOM's intact, fundi    benign, both eyes  Ears:    Normal TM's and external ear canals, both ears  Nose:   Nares normal, septum midline, mucosa normal, no drainage    or sinus tenderness  Throat:   Lips, mucosa, and tongue normal; teeth and gums normal  Neck:   Supple, symmetrical, trachea midline, no adenopathy;    thyroid:  no enlargement/tenderness/nodules; no carotid   bruit or JVD  Back:     Symmetric, no curvature, ROM normal, no CVA  tenderness  Lungs:     Clear to auscultation bilaterally, respirations unlabored  Chest Wall:    No tenderness or deformity   Heart:    Normal heart rate. Normal rhythm. No murmurs, rubs, or gallops.   Breast Exam:    deferred  Abdomen:     Soft, non-tender, bowel sounds active all four quadrants,    no masses, no organomegaly  Pelvic:    deferred  Extremities:   All extremities are intact. No cyanosis or edema  Pulses:   2+ and symmetric all extremities  Skin:   Several small macular lesions, one slightly raised flat lesion that stands out over mid back about 1.2cm irregular shape with well circumscribed edges and irregular pigmentation.   Lymph nodes:   Cervical, supraclavicular, and axillary nodes normal  Neurologic:   CNII-XII intact, normal strength, sensation and reflexes    throughout     Last depression screening scores    06/27/2022    1:23 PM  PHQ 2/9 Scores  PHQ - 2 Score 2  PHQ- 9 Score 7   Last fall risk screening    06/27/2022    1:23 PM  [place] in the past year? 0  Number falls in past yr: 0  Injury with Fall? 0  Risk for fall due to : No Fall Risks  Follow up Falls evaluation completed   Last Audit-C alcohol use screening    06/27/2022    1:24 PM  Alcohol Use Disorder Test (AUDIT)  1. How often do you have a drink containing alcohol? 4  2. How many drinks containing alcohol do you have on a typical day when you are drinking? 0  3. How often do you have six or more drinks on one occasion? 0  AUDIT-C Score 4   A score of 3 or more in women, and 4 or more in men indicates increased risk for alcohol abuse, EXCEPT if all of the points are from question 1   No results found for any visits on 06/27/22.  Assessment & Plan    Routine Health Maintenance and Physical Exam  Exercise Activities and Dietary recommendations  Goals   None     Immunization History  Administered Date(s) Administered   Influenza,inj,Quad PF,6+ Mos 07/19/2019    Influenza-Unspecified 07/28/2015, 06/29/2018, 07/19/2019, 07/24/2021   PFIZER(Purple Top)SARS-COV-2 Vaccination 11/21/2019, 12/12/2019    Health Maintenance  Topic Date Due   HIV Screening  Never done   Hepatitis C Screening  Never done   Zoster Vaccines- Shingrix (1 of 2) Never done   INFLUENZA VACCINE  01/11/2023 (Originally 05/13/2022)   MAMMOGRAM  05/31/2023   PAP SMEAR-Modifier  05/04/2025  COLONOSCOPY (Pts 45-64yr Insurance coverage will need to be confirmed)  11/02/2025   TETANUS/TDAP  06/27/2032   COVID-19 Vaccine  Completed   HPV VACCINES  Aged Out    Discussed health benefits of physical activity, and encouraged her to engage in regular exercise appropriate for her age and condition.   - CBC - Comprehensive metabolic panel - Lipid panel - TSH  She anticipates getting flu vaccine at work.   2. Need for Tdap vaccination  - Tdap vaccine greater than or equal to 7yo IM  3. Encounter for special screening examination for cardiovascular disorder  - Lipid panel  4. Encounter for screening mammogram for malignant neoplasm of breast  - MM 3D SCREEN BREAST BILATERAL Given contact information to [place].   5. Need for hepatitis C screening test  - Hepatitis C antibody  6. Skin lesion of back Is c/w S.K, however she reports history of pre-cancerous skin moles. She has been seen Dr. IKellie Moorin the past and she agrees to call her office to schedule a skin exam.   7. Other fatigue  - CBC - Comprehensive metabolic panel - Magnesium  8. Vitamin D deficiency  - VITAMIN D 25 Hydroxy (Vit-D Deficiency, Fractures)  9. Low back and hip pain.  Advised she could call back for referral if needed.   - valACYclovir (VALTREX) 500 MG tablet; TAKE ONE TABLET TWICE A DAY FOR 3 DAYS  Dispense: 6 tablet; Refill: 6     The entirety of the information documented in the History of Present Illness, Review of Systems and Physical Exam were personally obtained by me. Portions of  this information were initially documented by the CMA and reviewed by me for thoroughness and accuracy.     DLelon Huh MD  [phone](phone) [phone](fax)  [place]

## 2022-06-28 LAB — LIPID PANEL
Chol/HDL Ratio: 4.2 ratio (ref 0.0–4.4)
Cholesterol, Total: 219 mg/dL — ABNORMAL HIGH (ref 100–199)
HDL: 52 mg/dL (ref >39)
LDL Chol Calc (NIH): 132 mg/dL — ABNORMAL HIGH (ref 0–99)
Triglycerides: 199 mg/dL — ABNORMAL HIGH (ref 0–149)
VLDL Cholesterol Cal: 35 mg/dL (ref 5–40)

## 2022-06-28 LAB — CBC
Hematocrit: 45.5 % (ref 34.0–46.6)
Hemoglobin: 15.2 g/dL (ref 11.1–15.9)
MCH: 27.7 pg (ref 26.6–33.0)
MCHC: 33.4 g/dL (ref 31.5–35.7)
MCV: 83 fL (ref 79–97)
Platelets: 432 10*3/uL (ref 150–450)
RBC: 5.48 x10E6/uL — ABNORMAL HIGH (ref 3.77–5.28)
RDW: 13 % (ref 11.7–15.4)
WBC: 12.4 10*3/uL — ABNORMAL HIGH (ref 3.4–10.8)

## 2022-06-28 LAB — COMPREHENSIVE METABOLIC PANEL
ALT: 16 IU/L (ref 0–32)
AST: 17 IU/L (ref 0–40)
Albumin/Globulin Ratio: 1.9 (ref 1.2–2.2)
Albumin: 5 g/dL — ABNORMAL HIGH (ref 3.9–4.9)
Alkaline Phosphatase: 107 IU/L (ref 44–121)
BUN/Creatinine Ratio: 23 (ref 12–28)
BUN: 20 mg/dL (ref 8–27)
Bilirubin Total: 0.2 mg/dL (ref 0.0–1.2)
CO2: 22 mmol/L (ref 20–29)
Calcium: 10.3 mg/dL (ref 8.7–10.3)
Chloride: 101 mmol/L (ref 96–106)
Creatinine, Ser: 0.86 mg/dL (ref 0.57–1.00)
Globulin, Total: 2.7 g/dL (ref 1.5–4.5)
Glucose: 106 mg/dL — ABNORMAL HIGH (ref 70–99)
Potassium: 4.4 mmol/L (ref 3.5–5.2)
Sodium: 140 mmol/L (ref 134–144)
Total Protein: 7.7 g/dL (ref 6.0–8.5)
eGFR: 77 mL/min/{1.73_m2} (ref >59)

## 2022-06-28 LAB — MAGNESIUM: Magnesium: 2.3 mg/dL (ref 1.6–2.3)

## 2022-06-28 LAB — VITAMIN D 25 HYDROXY (VIT D DEFICIENCY, FRACTURES): Vit D, 25-Hydroxy: 28.9 ng/mL — ABNORMAL LOW (ref 30.0–100.0)

## 2022-06-28 LAB — HEPATITIS C ANTIBODY: Hep C Virus Ab: NONREACTIVE

## 2022-06-28 LAB — TSH: TSH: 1.28 u[IU]/mL (ref 0.450–4.500)

## 2022-07-24 ENCOUNTER — Ambulatory Visit
Admission: RE | Admit: 2022-07-24 | Discharge: 2022-07-24 | Disposition: A | Discharge status: Home / Self Care | Payer: 59 | Source: Ambulatory Visit | Attending: Family Medicine | Admitting: Family Medicine

## 2022-07-24 DIAGNOSIS — Z1231 Encounter for screening mammogram for malignant neoplasm of breast: Secondary | ICD-10-CM | POA: Insufficient documentation

## 2022-07-30 DIAGNOSIS — J0101 Acute recurrent maxillary sinusitis: Secondary | ICD-10-CM | POA: Diagnosis not present

## 2022-09-05 ENCOUNTER — Other Ambulatory Visit: Payer: Self-pay

## 2022-10-27 ENCOUNTER — Encounter: Payer: Self-pay | Admitting: Family Medicine

## 2022-10-27 ENCOUNTER — Ambulatory Visit: Payer: 59 | Admitting: Family Medicine

## 2022-10-27 VITALS — BP 110/71 | HR 76 | Ht 63.0 in | Wt 153.9 lb

## 2022-10-27 DIAGNOSIS — G5702 Lesion of sciatic nerve, left lower limb: Secondary | ICD-10-CM

## 2022-10-27 DIAGNOSIS — H1013 Acute atopic conjunctivitis, bilateral: Secondary | ICD-10-CM

## 2022-10-27 MED ORDER — AZELASTINE HCL 0.05 % OP SOLN: 1.0000 [drp] | Freq: Two times a day (BID) | OPHTHALMIC | 3 refills | Status: DC

## 2022-10-27 MED ORDER — MELOXICAM 7.5 MG PO TABS: 7.5000 mg | ORAL_TABLET | Freq: Every day | ORAL | 0 refills | Status: DC

## 2022-10-27 MED ORDER — GABAPENTIN 100 MG PO CAPS: 100.0000 mg | ORAL_CAPSULE | Freq: Every day | ORAL | 2 refills | Status: DC

## 2022-10-27 MED ORDER — TRIAMCINOLONE ACETONIDE 0.025 % EX OINT: TOPICAL_OINTMENT | CUTANEOUS | 1 refills | Status: AC

## 2022-10-27 NOTE — Progress Notes (Signed)
I,Sha'taria Tyson,acting as a Education administrator for Lelon Huh, MD.,have documented all relevant documentation on the behalf of Lelon Huh, MD,as directed by  Lelon Huh, MD while in the presence of Lelon Huh, MD.    Established patient visit   Patient: Sarah Cox   DOB: [birth year]   62 y.o. Female  MRN: [id number] Visit Date: 10/27/2022  Today's healthcare provider: Lelon Huh, MD   No chief complaint on file.  Subjective    HPI  Eyelid irritation for a few weeks now (2-3 weeks). Was getting better and started getting bad Saturday, yesterday was pinkish/purple and really dry. Eyes always feel tired. Patient has been using a lubricant in eyes. Reports it didn't make things worse but did not make better. Would also like to know what to do about arthritis in lower back and causing sciatic pain. Mainly hurts when standing in one place for an extended period of time. Mainly on the left side. She does report perennial allergy symptoms for which she has been taking OTC Xyzal, and Flonase when allergies flare up in the spring.   She also reports worsening sciatic pain on left going down back and side of her left leg. Has been evaluated at [facility] a few years ago with xrays which she states showed narrowing in her lower spine.     Medications: Outpatient Medications Prior to Visit  Medication Sig   fluticasone (FLONASE) 50 MCG/ACT nasal spray TAKE 2 SPRAYS INTO BOTH NOSTRILS DAILY   ibuprofen (ADVIL,MOTRIN) 200 MG tablet Take 200 mg by mouth every 6 (six) hours as needed.   Loratadine-Pseudoephedrine (CLARITIN-D 24 HOUR PO) Take 1 tablet by mouth daily.   valACYclovir (VALTREX) 500 MG tablet TAKE ONE TABLET TWICE A DAY FOR 3 DAYS   No facility-administered medications prior to visit.    Review of Systems  Constitutional:  Negative for appetite change, chills, fatigue and fever.  Respiratory:  Negative for chest tightness and shortness of breath.   Cardiovascular:  Negative for  chest pain and palpitations.  Gastrointestinal:  Negative for abdominal pain, nausea and vomiting.  Neurological:  Negative for dizziness and weakness.       Objective    BP 110/71 (BP Location: Left Arm, Patient Position: Sitting, Cuff Size: Normal)   Pulse 76   Ht '5\' 3"'$  (1.6 m)   Wt 153 lb 14.4 oz (69.8 kg)   LMP 07/07/2017 (Exact Date)   SpO2 98%   BMI 27.26 kg/m    Physical Exam   Pale conjunctiva bilaterally. Dry and flaking external upper eyelids.    Assessment & Plan     1. Allergic conjunctivitis of both eyes  - azelastine (OPTIVAR) 0.05 % ophthalmic solution; Place 1 drop into both eyes 2 (two) times daily.  Dispense: 6 mL; Refill: 3 - triamcinolone (KENALOG) 0.025 % ointment; Apply to external eyelids sparingly once a day  Dispense: 15 g; Refill: 1  2. Sciatic neuropathy, left  - gabapentin (NEURONTIN) 100 MG capsule; Take 1-3 capsules (100-300 mg total) by mouth at bedtime.  Dispense: 90 capsule; Refill: 2 - meloxicam (MOBIC) 7.5 MG tablet; Take 1 tablet (7.5 mg total) by mouth daily.  Dispense: 30 tablet; Refill: 0 - Ambulatory referral to Physical Therapy      The entirety of the information documented in the History of Present Illness, Review of Systems and Physical Exam were personally obtained by me. Portions of this information were initially documented by the CMA and reviewed by me for thoroughness  and accuracy.     Lelon Huh, MD  [facility] [phone] (phone) [phone] (fax)  [place]

## 2022-12-02 ENCOUNTER — Encounter: Payer: Self-pay | Admitting: Family Medicine

## 2022-12-03 ENCOUNTER — Other Ambulatory Visit: Payer: Self-pay

## 2022-12-03 DIAGNOSIS — J0110 Acute frontal sinusitis, unspecified: Secondary | ICD-10-CM

## 2022-12-03 DIAGNOSIS — G5702 Lesion of sciatic nerve, left lower limb: Secondary | ICD-10-CM

## 2022-12-03 MED ORDER — MELOXICAM 7.5 MG PO TABS: 7.5000 mg | ORAL_TABLET | Freq: Every day | ORAL | 3 refills | Status: DC

## 2022-12-03 MED ORDER — FLUTICASONE PROPIONATE 50 MCG/ACT NA SUSP: NASAL | 6 refills | Status: DC

## 2023-02-09 NOTE — Progress Notes (Signed)
Vivien Rota DeSanto,acting as a scribe for Mila Merry, MD.,have documented all relevant documentation on the behalf of Mila Merry, MD,as directed by  Mila Merry, MD while in the presence of Mila Merry, MD.    Established patient visit   Patient: Sarah Cox   DOB: 1960-12-05   62 y.o. Female  MRN: [id number] Visit Date: 02/10/2023  Today's healthcare provider: Mila Merry, MD    Subjective    HPI  Patient is a 62 year old female who presents for evaluation of cough, fatigue and sore throat.  She began having symptoms on 02/04/23 of headache and sore throat.  Cough started on 02/05/23.  She felt good on 4/26-27/24.  On Sunday 02/08/23 when she woke up her cough was much worse, she had head congestion, chest congestion, wheezing and some shortness of breath.   She has treated her symptoms with NyQuil and DayQuil.  She tested negative for covid-19 on 02/08/23 and 02/09/23.  Patient would also like to get referral to orthopedics for her back pain.  She reports that the Gabapentin and Meloxicam are not helping the pain.    Medications: Outpatient Medications Prior to Visit  Medication Sig   azelastine (OPTIVAR) 0.05 % ophthalmic solution Place 1 drop into both eyes 2 (two) times daily.   b complex vitamins capsule Take 1 capsule by mouth daily.   fluticasone (FLONASE) 50 MCG/ACT nasal spray TAKE 2 SPRAYS INTO BOTH NOSTRILS DAILY   ibuprofen (ADVIL,MOTRIN) 200 MG tablet Take 200 mg by mouth every 6 (six) hours as needed.   Loratadine-Pseudoephedrine (CLARITIN-D 24 HOUR PO) Take 1 tablet by mouth daily.   omega-3 fish oil (MAXEPA) 1000 MG CAPS capsule Take by mouth.   Probiotic Product (PROBIOTIC DAILY PO) Take by mouth.   triamcinolone (KENALOG) 0.025 % ointment Apply to external eyelids sparingly once a day   valACYclovir (VALTREX) 500 MG tablet TAKE ONE TABLET TWICE A DAY FOR 3 DAYS   VITAMIN D, CHOLECALCIFEROL, PO Take by mouth.   gabapentin (NEURONTIN) 100 MG capsule Take  1-3 capsules (100-300 mg total) by mouth at bedtime. (Patient not taking: Reported on 02/10/2023)   meloxicam (MOBIC) 7.5 MG tablet Take 1 tablet (7.5 mg total) by mouth daily. (Patient not taking: Reported on 02/10/2023)   No facility-administered medications prior to visit.    Review of Systems  Constitutional:  Positive for chills, diaphoresis and fatigue. Negative for appetite change and fever.  HENT:  Positive for congestion, hearing loss, postnasal drip, rhinorrhea, sinus pressure, sinus pain, sneezing, sore throat, trouble swallowing and voice change. Negative for ear discharge, ear pain and tinnitus.   Eyes:  Positive for photophobia, redness, itching and visual disturbance. Negative for pain and discharge.  Respiratory:  Positive for cough, chest tightness, shortness of breath and wheezing.   Cardiovascular:  Negative for chest pain, palpitations and leg swelling.  Gastrointestinal:  Positive for abdominal pain. Negative for abdominal distention, blood in stool, constipation, diarrhea, nausea and vomiting.  Musculoskeletal:  Positive for arthralgias and myalgias.  Neurological:  Positive for dizziness, light-headedness and headaches.       Objective    BP 112/75 (BP Location: Left Arm, Patient Position: Sitting, Cuff Size: Normal)   Pulse 82   Temp 98.2 F (36.8 C) (Oral)   Wt 153 lb (69.4 kg)   LMP 07/07/2017 (Exact Date)   SpO2 99%   BMI 27.10 kg/m    Physical Exam   General Appearance:    Well developed, well nourished  female, alert, cooperative, in no acute distress  HENT:   bilateral TM normal without fluid or infection, neck without nodes, frontal sinus tender, post nasal drip noted, and nasal mucosa pale and congested  Eyes:    PERRL, conjunctiva/corneas clear, EOM's intact       Lungs:     Clear to auscultation bilaterally, respirations unlabored. Occasional expiratory wheeze  Heart:    Normal heart rate. Normal rhythm. No murmurs, rubs, or gallops.    Neurologic:    Awake, alert, oriented x 3. No apparent focal neurological           defect.         Assessment & Plan     1. Acute non-recurrent frontal sinusitis  - azithromycin (ZITHROMAX) 250 MG tablet; Take 2 tablets on day 1, then 1 tablet daily on days 2 through 5  Dispense: 6 tablet; Refill: 0 - fluticasone (FLONASE) 50 MCG/ACT nasal spray; TAKE 2 SPRAYS INTO BOTH NOSTRILS DAILY  Dispense: 16 g; Refill: 2  2. Sciatic neuropathy, left She was started on meloxicam and gabapentin in January which she states did not help with pain.  - DG Lumbar Spine Complete; Future  Consider PT or orthopedic referral after reviewing xrays.       The entirety of the information documented in the History of Present Illness, Review of Systems and Physical Exam were personally obtained by me. Portions of this information were initially documented by the CMA and reviewed by me for thoroughness and accuracy.     Mila Merry, MD  [facility] [phone] (phone) [phone] (fax)  [facility]

## 2023-02-10 ENCOUNTER — Ambulatory Visit: Payer: 59 | Admitting: Family Medicine

## 2023-02-10 VITALS — BP 112/75 | HR 82 | Temp 98.2°F | Wt 153.0 lb

## 2023-02-10 DIAGNOSIS — G5702 Lesion of sciatic nerve, left lower limb: Secondary | ICD-10-CM

## 2023-02-10 DIAGNOSIS — J0110 Acute frontal sinusitis, unspecified: Secondary | ICD-10-CM

## 2023-02-10 MED ORDER — FLUTICASONE PROPIONATE 50 MCG/ACT NA SUSP: NASAL | 2 refills | Status: DC

## 2023-02-10 MED ORDER — AZITHROMYCIN 250 MG PO TABS: ORAL_TABLET | ORAL | 0 refills | Status: AC

## 2023-02-12 ENCOUNTER — Other Ambulatory Visit: Payer: Self-pay | Admitting: Family Medicine

## 2023-02-12 DIAGNOSIS — G5702 Lesion of sciatic nerve, left lower limb: Secondary | ICD-10-CM

## 2023-02-12 MED ORDER — METHYLPREDNISOLONE 4 MG PO TBPK: ORAL_TABLET | ORAL | 0 refills | Status: AC

## 2023-02-12 MED ORDER — MELOXICAM 7.5 MG PO TABS
7.5000 mg | ORAL_TABLET | Freq: Every day | ORAL | 3 refills | Status: DC | PRN
Start: 2023-02-12 — End: 2024-08-02

## 2023-03-16 ENCOUNTER — Telehealth: Payer: Self-pay

## 2023-03-16 DIAGNOSIS — G5702 Lesion of sciatic nerve, left lower limb: Secondary | ICD-10-CM

## 2023-03-16 NOTE — Telephone Encounter (Signed)
Copied from CRM [id number]. Topic: Referral - Request for Referral >> Mar 16, 2023  9:53 AM Everette C wrote: Has patient seen PCP for this complaint? Yes.   *If NO, is insurance requiring patient see PCP for this issue before PCP can refer them? Referral for which specialty: Radiology / Imaging  Preferred provider/office: [facility] outpatient imaging  Reason for referral: patient would like an xray

## 2023-03-17 ENCOUNTER — Encounter: Payer: Self-pay | Admitting: Family Medicine

## 2023-03-18 NOTE — Telephone Encounter (Signed)
Order placed, no appt necessary.

## 2023-03-18 NOTE — Addendum Note (Signed)
Addended by: Malva Limes on: 03/18/2023 08:01 AM   Modules accepted: Orders

## 2023-03-18 NOTE — Telephone Encounter (Signed)
Patient advised.

## 2023-03-20 ENCOUNTER — Ambulatory Visit
Admission: RE | Admit: 2023-03-20 | Discharge: 2023-03-20 | Disposition: A | Discharge status: Home / Self Care | Payer: 59 | Attending: Family Medicine | Admitting: Family Medicine

## 2023-03-20 ENCOUNTER — Ambulatory Visit
Admission: RE | Admit: 2023-03-20 | Discharge: 2023-03-20 | Disposition: A | Discharge status: Home / Self Care | Payer: 59 | Source: Ambulatory Visit | Attending: Family Medicine | Admitting: Family Medicine

## 2023-03-20 DIAGNOSIS — G5702 Lesion of sciatic nerve, left lower limb: Secondary | ICD-10-CM | POA: Insufficient documentation

## 2023-03-20 DIAGNOSIS — M5136 Other intervertebral disc degeneration, lumbar region: Secondary | ICD-10-CM | POA: Diagnosis not present

## 2023-03-20 DIAGNOSIS — M545 Low back pain, unspecified: Secondary | ICD-10-CM | POA: Diagnosis not present

## 2023-03-25 ENCOUNTER — Encounter: Payer: Self-pay | Admitting: Family Medicine

## 2023-04-09 DIAGNOSIS — Z882 Allergy status to sulfonamides status: Secondary | ICD-10-CM | POA: Diagnosis not present

## 2023-04-09 DIAGNOSIS — Z881 Allergy status to other antibiotic agents status: Secondary | ICD-10-CM | POA: Diagnosis not present

## 2023-04-09 DIAGNOSIS — M545 Low back pain, unspecified: Secondary | ICD-10-CM | POA: Diagnosis not present

## 2023-04-09 DIAGNOSIS — K219 Gastro-esophageal reflux disease without esophagitis: Secondary | ICD-10-CM | POA: Diagnosis not present

## 2023-04-09 DIAGNOSIS — Z87891 Personal history of nicotine dependence: Secondary | ICD-10-CM | POA: Diagnosis not present

## 2023-04-09 DIAGNOSIS — J309 Allergic rhinitis, unspecified: Secondary | ICD-10-CM | POA: Diagnosis not present

## 2023-04-09 DIAGNOSIS — Z803 Family history of malignant neoplasm of breast: Secondary | ICD-10-CM | POA: Diagnosis not present

## 2023-04-09 DIAGNOSIS — G8929 Other chronic pain: Secondary | ICD-10-CM | POA: Diagnosis not present

## 2023-04-14 ENCOUNTER — Ambulatory Visit: Payer: Self-pay

## 2023-04-14 NOTE — Telephone Encounter (Signed)
Chief Complaint: Stomach ache Symptoms: nausea, upset stomach, cough Frequency: onset Saturday morning comes and goes  Pertinent Negatives: Patient denies SOB and pain Disposition: [] ED /[] Urgent Care (no appt availability in office) / [x] Appointment(In office/virtual)/ []  [facility]/ [] Home Care/ [] Refused Recommended Disposition /[] [facility]/ []  Follow-up with PCP Additional Notes: Patient called in to report that she has an upset stomach that started on Saturday morning. She reports that she has been around others with similar symptoms at work recently. She reports nausea, chills and feeling warm but has not been able to check her temperature. Patient also stated that her partner woke up with a upset stomach this morning as well. Patient states that she is able to drink fluids and that makes her feel a little better. Advised patient that she will need to be seen for evaluation and patient was agreeable. Patient has been scheduled for Friday, April 17, 2023. Patient states if she starts to feel better she will call to cancel the appointment.   Reason for Disposition  [1] MILD pain (e.g., does not interfere with normal activities) AND [2] pain comes and goes (cramps) AND [3] present > 48 hours  (Exception: This same abdominal pain is a chronic symptom recurrent or ongoing AND present > 4 weeks.)  Answer Assessment - Initial Assessment Questions 1. LOCATION: "Where does it hurt?"      My stomach, right up under my rib cage all the way across the stomach. 2. RADIATION: "Does the pain shoot anywhere else?" (e.g., chest, back)     No 3. ONSET: "When did the pain begin?" (e.g., minutes, hours or days ago)      Saturday morning and has gotten worse 4. SUDDEN: "Gradual or sudden onset?"     Gradual 5. PATTERN "Does the pain come and go, or is it constant?"    - If it comes and goes: "How long does it last?" "Do you have pain now?"     (Note: Comes and goes means the pain is  intermittent. It goes away completely between bouts.)    - If constant: "Is it getting better, staying the same, or getting worse?"      (Note: Constant means the pain never goes away completely; most serious pain is constant and gets worse.)      Come and going but when it comes it stays for a while  6. SEVERITY: "How bad is the pain?"  (e.g., Scale 1-10; mild, moderate, or severe)    - MILD (1-3): Doesn't interfere with normal activities, abdomen soft and not tender to touch.     - MODERATE (4-7): Interferes with normal activities or awakens from sleep, abdomen tender to touch.     - SEVERE (8-10): Excruciating pain, doubled over, unable to do any normal activities.       When it is hurting I would say an 8 out of 10. But right now it is a 3 out 10.   8. CAUSE: "What do you think is causing the stomach pain?"     Other girls at work have had upset stomachs recently  9. RELIEVING/AGGRAVATING FACTORS: "What makes it better or worse?" (e.g., antacids, bending or twisting motion, bowel movement)    Drinking fluids is helpful  10. OTHER SYMPTOMS: "Do you have any other symptoms?" (e.g., back pain, diarrhea, fever, urination pain, vomiting)       Coughing, nausea, felt warm Sunday like a fever but I did not actually check it.  Protocols used:  Abdominal Pain - Female-A-AH

## 2023-04-15 ENCOUNTER — Telehealth: Payer: Self-pay

## 2023-04-15 NOTE — Telephone Encounter (Signed)
Advised   Copied from CRM [id number]. Topic: Appointment Scheduling - Scheduling Inquiry for Clinic >> Apr 14, 2023  3:33 PM Sarah Cox wrote: Patient has an appointment for July 5th, she just tested positive for covid. She ask should she still come in? Please call and advise. Thank you

## 2023-04-15 NOTE — Telephone Encounter (Signed)
Her appt is with Dr Payton Mccallum, but she needs to rescheduled to at least 10 days after onset of Covid sx.

## 2023-04-17 ENCOUNTER — Ambulatory Visit: Payer: 59 | Admitting: Family Medicine

## 2023-04-26 DIAGNOSIS — R051 Acute cough: Secondary | ICD-10-CM | POA: Diagnosis not present

## 2023-04-26 DIAGNOSIS — J0140 Acute pansinusitis, unspecified: Secondary | ICD-10-CM | POA: Diagnosis not present

## 2023-04-26 DIAGNOSIS — Z6826 Body mass index (BMI) 26.0-26.9, adult: Secondary | ICD-10-CM | POA: Diagnosis not present

## 2023-06-30 ENCOUNTER — Other Ambulatory Visit: Payer: Self-pay | Admitting: Family Medicine

## 2023-06-30 DIAGNOSIS — Z1231 Encounter for screening mammogram for malignant neoplasm of breast: Secondary | ICD-10-CM

## 2023-07-01 ENCOUNTER — Other Ambulatory Visit: Payer: Self-pay | Admitting: Family Medicine

## 2023-07-22 DIAGNOSIS — Z6827 Body mass index (BMI) 27.0-27.9, adult: Secondary | ICD-10-CM | POA: Diagnosis not present

## 2023-07-22 DIAGNOSIS — J029 Acute pharyngitis, unspecified: Secondary | ICD-10-CM | POA: Diagnosis not present

## 2023-07-22 DIAGNOSIS — J069 Acute upper respiratory infection, unspecified: Secondary | ICD-10-CM | POA: Diagnosis not present

## 2023-07-22 DIAGNOSIS — Z1152 Encounter for screening for COVID-19: Secondary | ICD-10-CM | POA: Diagnosis not present

## 2023-07-30 ENCOUNTER — Ambulatory Visit
Admission: RE | Admit: 2023-07-30 | Discharge: 2023-07-30 | Disposition: A | Discharge status: Home / Self Care | Payer: 59 | Source: Ambulatory Visit | Attending: Family Medicine | Admitting: Family Medicine

## 2023-07-30 DIAGNOSIS — Z1231 Encounter for screening mammogram for malignant neoplasm of breast: Secondary | ICD-10-CM | POA: Diagnosis not present

## 2023-08-28 ENCOUNTER — Encounter: Payer: 59 | Admitting: Family Medicine

## 2023-10-16 ENCOUNTER — Encounter: Payer: Self-pay | Admitting: Family Medicine

## 2023-10-16 ENCOUNTER — Ambulatory Visit (INDEPENDENT_AMBULATORY_CARE_PROVIDER_SITE_OTHER): Payer: 59 | Admitting: Family Medicine

## 2023-10-16 VITALS — BP 111/74 | HR 76 | Resp 18 | Ht 63.0 in | Wt 156.0 lb

## 2023-10-16 DIAGNOSIS — Z0000 Encounter for general adult medical examination without abnormal findings: Secondary | ICD-10-CM

## 2023-10-16 DIAGNOSIS — S93492A Sprain of other ligament of left ankle, initial encounter: Secondary | ICD-10-CM

## 2023-10-16 DIAGNOSIS — Z0001 Encounter for general adult medical examination with abnormal findings: Secondary | ICD-10-CM

## 2023-10-16 DIAGNOSIS — Z23 Encounter for immunization: Secondary | ICD-10-CM | POA: Diagnosis not present

## 2023-10-16 DIAGNOSIS — F5101 Primary insomnia: Secondary | ICD-10-CM | POA: Diagnosis not present

## 2023-10-16 DIAGNOSIS — E559 Vitamin D deficiency, unspecified: Secondary | ICD-10-CM | POA: Diagnosis not present

## 2023-10-16 DIAGNOSIS — S93492D Sprain of other ligament of left ankle, subsequent encounter: Secondary | ICD-10-CM

## 2023-10-16 NOTE — Patient Instructions (Addendum)
 Please review the attached list of medications and notify my office if there are any errors.   Be sure to schedule an appointment with your dermatologist for a complete skin exam

## 2023-10-16 NOTE — Progress Notes (Signed)
 Complete physical exam   Patient: Sarah Cox   DOB: July 20, 1961   63 y.o. Female  MRN: [id number] Visit Date: 10/16/2023  Today's healthcare provider: Nancyann Perry, MD   Chief Complaint  Patient presents with   Annual Exam   Subjective    Sarah Cox is a 63 y.o. female who presents today for a complete physical exam.  She reports consuming a  healthy  diet.  She generally feels well. She reports sleeping poorly. She has been taking melatonin which helps fall asleep, but wake frequently through the night. Is planning on retiring  last this month and will be caring for grandchild. She sprained her left ankle in August and is still bothering her. She is in process of getting in with podiatrist to evaluate.   Had been on vitamin D  supplement which she stopped taking several months ago. Has been trying to eat healthier since lipids up a bit when last checked.  Lab Results  Component Value Date   VD25OH 28.9 (L) 06/27/2022   Lab Results  Component Value Date   NA 140 06/27/2022   K 4.4 06/27/2022   CREATININE 0.86 06/27/2022   EGFR 77 06/27/2022   GLUCOSE 106 (H) 06/27/2022   Lab Results  Component Value Date   CHOL 219 (H) 06/27/2022   HDL 52 06/27/2022   LDLCALC 132 (H) 06/27/2022   TRIG 199 (H) 06/27/2022   CHOLHDL 4.2 06/27/2022     Past Medical History:  Diagnosis Date   Ankle sprain    Arthritis    Past Surgical History:  Procedure Laterality Date   CESAREAN SECTION  1992   LYMPHADENECTOMY  2007   FACE   TUBAL LIGATION  1997   WISDOM TOOTH EXTRACTION     Social History   Socioeconomic History   Marital status: Divorced    Spouse name: Not on file   Number of children: Not on file   Years of education: Not on file   Highest education level: Not on file  Occupational History   Not on file  Tobacco Use   Smoking status: Never   Smokeless tobacco: Never  Vaping Use   Vaping status: Never Used  Substance and Sexual Activity   Alcohol use: Yes     Alcohol/week: 0.0 standard drinks of alcohol    Comment: MODERATE USE   Drug use: No   Sexual activity: Yes    Birth control/protection: None, Surgical  Other Topics Concern   Not on file  Social History Narrative   Not on file   Social Drivers of Health   Financial Resource Strain: Not on file  Food Insecurity: Not on file  Transportation Needs: Not on file  Physical Activity: Not on file  Stress: Not on file  Social Connections: Not on file  Intimate Partner Violence: Not on file   Family Status  Relation Name Status   Mother  Deceased   Father  Deceased  No partnership data on file   Family History  Problem Relation Age of Onset   Osteoporosis Mother    Breast cancer Mother 34   Anemia Mother    Colon cancer Father    Allergies  Allergen Reactions   Ciprofloxacin Rash   Sulfa Antibiotics Rash    Patient Care Team: Perry Nancyann BRAVO, MD as PCP - General (Family Medicine) Isenstein, Arin L, MD (Dermatology) Toledo, Teodoro K, MD as Consulting Physician (Gastroenterology) Tye Millet, DO as Consulting Physician (Surgery)  Medications: Outpatient Medications Prior to Visit  Medication Sig   azelastine  (OPTIVAR ) 0.05 % ophthalmic solution Place 1 drop into both eyes 2 (two) times daily.   b complex vitamins capsule Take 1 capsule by mouth daily.   fluticasone  (FLONASE ) 50 MCG/ACT nasal spray TAKE 2 SPRAYS INTO BOTH NOSTRILS DAILY   ibuprofen (ADVIL,MOTRIN) 200 MG tablet Take 200 mg by mouth every 6 (six) hours as needed.   omega-3 fish oil (MAXEPA) 1000 MG CAPS capsule Take by mouth.   Probiotic Product (PROBIOTIC DAILY PO) Take by mouth.   valACYclovir  (VALTREX ) 500 MG tablet TAKE ONE (1) TABLET BY MOUTH TWO TIMES PER DAY FOR THREE DAYS   Loratadine-Pseudoephedrine (CLARITIN-D 24 HOUR PO) Take 1 tablet by mouth daily. (Patient not taking: Reported on 10/16/2023)   meloxicam  (MOBIC ) 7.5 MG tablet Take 1 tablet (7.5 mg total) by mouth daily as needed for pain.  (Patient not taking: Reported on 10/16/2023)   triamcinolone  (KENALOG ) 0.025 % ointment Apply to external eyelids sparingly once a day (Patient not taking: Reported on 10/16/2023)   VITAMIN D , CHOLECALCIFEROL, PO Take by mouth. (Patient not taking: Reported on 10/16/2023)   No facility-administered medications prior to visit.    Review of Systems  Constitutional:  Negative for chills, fatigue and fever.  HENT:  Negative for congestion, ear pain, rhinorrhea, sneezing and sore throat.   Eyes: Negative.  Negative for pain and redness.  Respiratory:  Negative for cough, shortness of breath and wheezing.   Cardiovascular:  Negative for chest pain and leg swelling.  Gastrointestinal:  Negative for abdominal pain, blood in stool, constipation, diarrhea and nausea.  Endocrine: Negative for polydipsia and polyphagia.  Genitourinary: Negative.  Negative for dysuria, flank pain, hematuria, pelvic pain, vaginal bleeding and vaginal discharge.  Musculoskeletal:  Positive for arthralgias. Negative for back pain, gait problem and joint swelling.  Skin:  Negative for rash.  Neurological: Negative.  Negative for dizziness, tremors, seizures, weakness, light-headedness, numbness and headaches.  Hematological:  Negative for adenopathy.  Psychiatric/Behavioral:  Positive for sleep disturbance. Negative for behavioral problems, confusion and dysphoric mood. The patient is not nervous/anxious and is not hyperactive.       Objective    BP 111/74   Pulse 76   Resp 18   Ht 5' 3 (1.6 m)   Wt 156 lb (70.8 kg)   LMP 07/07/2017 (Exact Date)   SpO2 97%   BMI 27.63 kg/m    Physical Exam   General Appearance:    Well developed, well nourished female. Alert, cooperative, in no acute distress, appears stated age   Head:    Normocephalic, without obvious abnormality, atraumatic  Eyes:    PERRL, conjunctiva/corneas clear, EOM's intact, fundi    benign, both eyes  Ears:    Normal TM's and external ear canals, both  ears  Nose:   Nares normal, septum midline, mucosa normal, no drainage    or sinus tenderness  Throat:   Lips, mucosa, and tongue normal; teeth and gums normal  Neck:   Supple, symmetrical, trachea midline, no adenopathy;    thyroid :  no enlargement/tenderness/nodules; no carotid   bruit or JVD  Back:     Symmetric, no curvature, ROM normal, no CVA tenderness  Lungs:     Clear to auscultation bilaterally, respirations unlabored  Chest Wall:    No tenderness or deformity   Heart:    Normal heart rate. Normal rhythm. No murmurs, rubs, or gallops.   Breast Exam:    deferred  Abdomen:     Soft, non-tender, bowel sounds active all four quadrants,    no masses, no organomegaly  Pelvic:    deferred  Extremities:   All extremities are intact. No cyanosis or edema  Pulses:   2+ and symmetric all extremities  Skin:   Skin color, texture, turgor normal, no rashes or lesions  Lymph nodes:   Cervical, supraclavicular, and axillary nodes normal  Neurologic:   CNII-XII intact, normal strength, sensation and reflexes    throughout     Last depression screening scores    10/16/2023    1:34 PM 02/10/2023    8:56 AM 06/27/2022    1:23 PM  PHQ 2/9 Scores  PHQ - 2 Score 2 2 2   PHQ- 9 Score 9 8 7    Last fall risk screening    10/16/2023    1:35 PM  Fall Risk   Falls in the past year? 1  Number falls in past yr: 0  Injury with Fall? 1   Last Audit-C alcohol use screening    02/10/2023    8:56 AM  Alcohol Use Disorder Test (AUDIT)  1. How often do you have a drink containing alcohol? 3  2. How many drinks containing alcohol do you have on a typical day when you are drinking? 0  3. How often do you have six or more drinks on one occasion? 0  AUDIT-C Score 3   A score of 3 or more in women, and 4 or more in men indicates increased risk for alcohol abuse, EXCEPT if all of the points are from question 1   No results found for any visits on 10/16/23.  Assessment & Plan    Routine Health  Maintenance and Physical Exam  Exercise Activities and Dietary recommendations  Goals   None     Immunization History  Administered Date(s) Administered   Influenza,inj,Quad PF,6+ Mos 07/19/2019   Influenza-Unspecified 07/28/2015, 06/29/2018, 07/19/2019, 07/24/2021   PFIZER(Purple Top)SARS-COV-2 Vaccination 11/07/2019, 11/28/2019   Pfizer Covid-19 Vaccine Bivalent Booster 58yrs & up 10/01/2020   Tdap 06/27/2022    Health Maintenance  Topic Date Due   HIV Screening  Never done   Zoster Vaccines- Shingrix (1 of 2) Never done   INFLUENZA VACCINE  05/14/2023   COVID-19 Vaccine (4 - 2024-25 season) 06/14/2023   Cervical Cancer Screening (HPV/Pap Cotest)  05/04/2025   MAMMOGRAM  07/29/2025   Colonoscopy  11/02/2025   DTaP/Tdap/Td (2 - Td or Tdap) 06/27/2032   Hepatitis C Screening  Completed   HPV VACCINES  Aged Out    Discussed health benefits of physical activity, and encouraged her to engage in regular exercise appropriate for her age and condition.   - CBC - Comprehensive metabolic panel - Lipid panel  Recommended Shingrix which she declined today since she is getting flu vaccine.   2. Vitamin D  deficiency Currently off vitamin d  supplements.  - VITAMIN D  25 Hydroxy (Vit-D Deficiency, Fractures)  3. Primary insomnia On melatonin, consider additional sleeping aid if not getting better after retiring last this months.   4. Need for influenza vaccination  - Flu vaccine trivalent PF, 6mos and older(Flulaval,Afluria,Fluarix,Fluzone) [IMM140]  5. Sprain of other ligament of left ankle, initial encounter Is planning on seeing specialist for this.         Nancyann Perry, MD  [facility] [phone] (phone) [phone] (fax)  [facility]

## 2023-10-17 LAB — COMPREHENSIVE METABOLIC PANEL
ALT: 21 [IU]/L (ref 0–32)
AST: 23 [IU]/L (ref 0–40)
Albumin: 4.8 g/dL (ref 3.9–4.9)
Alkaline Phosphatase: 90 [IU]/L (ref 44–121)
BUN/Creatinine Ratio: 20 (ref 12–28)
BUN: 15 mg/dL (ref 8–27)
Bilirubin Total: 0.2 mg/dL (ref 0.0–1.2)
CO2: 24 mmol/L (ref 20–29)
Calcium: 10.1 mg/dL (ref 8.7–10.3)
Chloride: 104 mmol/L (ref 96–106)
Creatinine, Ser: 0.75 mg/dL (ref 0.57–1.00)
Globulin, Total: 2.5 g/dL (ref 1.5–4.5)
Glucose: 86 mg/dL (ref 70–99)
Potassium: 4.8 mmol/L (ref 3.5–5.2)
Sodium: 141 mmol/L (ref 134–144)
Total Protein: 7.3 g/dL (ref 6.0–8.5)
eGFR: 89 mL/min/{1.73_m2} (ref >59)

## 2023-10-17 LAB — CBC
Hematocrit: 44.9 % (ref 34.0–46.6)
Hemoglobin: 14.4 g/dL (ref 11.1–15.9)
MCH: 27.9 pg (ref 26.6–33.0)
MCHC: 32.1 g/dL (ref 31.5–35.7)
MCV: 87 fL (ref 79–97)
Platelets: 382 10*3/uL (ref 150–450)
RBC: 5.16 x10E6/uL (ref 3.77–5.28)
RDW: 13 % (ref 11.7–15.4)
WBC: 11.9 10*3/uL — ABNORMAL HIGH (ref 3.4–10.8)

## 2023-10-17 LAB — LIPID PANEL
Chol/HDL Ratio: 3.8 {ratio} (ref 0.0–4.4)
Cholesterol, Total: 240 mg/dL — ABNORMAL HIGH (ref 100–199)
HDL: 63 mg/dL (ref >39)
LDL Chol Calc (NIH): 163 mg/dL — ABNORMAL HIGH (ref 0–99)
Triglycerides: 83 mg/dL (ref 0–149)
VLDL Cholesterol Cal: 14 mg/dL (ref 5–40)

## 2023-10-17 LAB — VITAMIN D 25 HYDROXY (VIT D DEFICIENCY, FRACTURES): Vit D, 25-Hydroxy: 40 ng/mL (ref 30.0–100.0)

## 2023-10-18 ENCOUNTER — Encounter: Payer: Self-pay | Admitting: Family Medicine

## 2023-10-18 DIAGNOSIS — J018 Other acute sinusitis: Secondary | ICD-10-CM

## 2023-10-23 MED ORDER — AMOXICILLIN 500 MG PO CAPS: 1000.0000 mg | ORAL_CAPSULE | Freq: Three times a day (TID) | ORAL | 0 refills | Status: AC

## 2023-11-24 DIAGNOSIS — R519 Headache, unspecified: Secondary | ICD-10-CM | POA: Diagnosis not present

## 2023-11-24 DIAGNOSIS — Z6827 Body mass index (BMI) 27.0-27.9, adult: Secondary | ICD-10-CM | POA: Diagnosis not present

## 2023-11-24 DIAGNOSIS — R0981 Nasal congestion: Secondary | ICD-10-CM | POA: Diagnosis not present

## 2023-11-24 DIAGNOSIS — U071 COVID-19: Secondary | ICD-10-CM | POA: Diagnosis not present

## 2024-01-13 ENCOUNTER — Other Ambulatory Visit: Payer: Self-pay | Admitting: Family Medicine

## 2024-01-13 DIAGNOSIS — J0110 Acute frontal sinusitis, unspecified: Secondary | ICD-10-CM

## 2024-02-01 ENCOUNTER — Encounter: Payer: Self-pay | Admitting: Family Medicine

## 2024-02-01 NOTE — Telephone Encounter (Signed)
 Called and was able to inform the pt that [facility] clinic will have access to her records via care everywhere, she verbally stated she understood and had no other concerns

## 2024-02-12 ENCOUNTER — Ambulatory Visit: Admitting: Orthopedic Surgery

## 2024-02-15 DIAGNOSIS — L821 Other seborrheic keratosis: Secondary | ICD-10-CM | POA: Diagnosis not present

## 2024-02-15 DIAGNOSIS — D2262 Melanocytic nevi of left upper limb, including shoulder: Secondary | ICD-10-CM | POA: Diagnosis not present

## 2024-02-15 DIAGNOSIS — L820 Inflamed seborrheic keratosis: Secondary | ICD-10-CM | POA: Diagnosis not present

## 2024-02-15 DIAGNOSIS — L538 Other specified erythematous conditions: Secondary | ICD-10-CM | POA: Diagnosis not present

## 2024-02-15 DIAGNOSIS — M47816 Spondylosis without myelopathy or radiculopathy, lumbar region: Secondary | ICD-10-CM | POA: Diagnosis not present

## 2024-02-15 DIAGNOSIS — L918 Other hypertrophic disorders of the skin: Secondary | ICD-10-CM | POA: Diagnosis not present

## 2024-02-15 DIAGNOSIS — D2261 Melanocytic nevi of right upper limb, including shoulder: Secondary | ICD-10-CM | POA: Diagnosis not present

## 2024-02-15 DIAGNOSIS — G5702 Lesion of sciatic nerve, left lower limb: Secondary | ICD-10-CM | POA: Diagnosis not present

## 2024-02-15 DIAGNOSIS — D2272 Melanocytic nevi of left lower limb, including hip: Secondary | ICD-10-CM | POA: Diagnosis not present

## 2024-02-15 DIAGNOSIS — R208 Other disturbances of skin sensation: Secondary | ICD-10-CM | POA: Diagnosis not present

## 2024-02-15 DIAGNOSIS — D2271 Melanocytic nevi of right lower limb, including hip: Secondary | ICD-10-CM | POA: Diagnosis not present

## 2024-02-15 DIAGNOSIS — D225 Melanocytic nevi of trunk: Secondary | ICD-10-CM | POA: Diagnosis not present

## 2024-02-15 DIAGNOSIS — L570 Actinic keratosis: Secondary | ICD-10-CM | POA: Diagnosis not present

## 2024-03-01 DIAGNOSIS — M47816 Spondylosis without myelopathy or radiculopathy, lumbar region: Secondary | ICD-10-CM | POA: Diagnosis not present

## 2024-03-08 DIAGNOSIS — M47816 Spondylosis without myelopathy or radiculopathy, lumbar region: Secondary | ICD-10-CM | POA: Diagnosis not present

## 2024-03-15 DIAGNOSIS — M47816 Spondylosis without myelopathy or radiculopathy, lumbar region: Secondary | ICD-10-CM | POA: Diagnosis not present

## 2024-04-25 ENCOUNTER — Encounter: Payer: Self-pay | Admitting: Family Medicine

## 2024-04-25 DIAGNOSIS — F5101 Primary insomnia: Secondary | ICD-10-CM

## 2024-04-26 MED ORDER — ZOLPIDEM TARTRATE 5 MG PO TABS: 5.0000 mg | ORAL_TABLET | Freq: Every evening | ORAL | 1 refills | Status: DC | PRN

## 2024-05-02 DIAGNOSIS — Z6826 Body mass index (BMI) 26.0-26.9, adult: Secondary | ICD-10-CM | POA: Diagnosis not present

## 2024-05-02 DIAGNOSIS — J0110 Acute frontal sinusitis, unspecified: Secondary | ICD-10-CM | POA: Diagnosis not present

## 2024-05-02 DIAGNOSIS — R059 Cough, unspecified: Secondary | ICD-10-CM | POA: Diagnosis not present

## 2024-06-17 ENCOUNTER — Other Ambulatory Visit: Payer: Self-pay | Admitting: Family Medicine

## 2024-06-17 DIAGNOSIS — Z1231 Encounter for screening mammogram for malignant neoplasm of breast: Secondary | ICD-10-CM

## 2024-07-28 ENCOUNTER — Ambulatory Visit: Admitting: Family Medicine

## 2024-08-01 ENCOUNTER — Ambulatory Visit
Admission: RE | Admit: 2024-08-01 | Discharge: 2024-08-01 | Disposition: A | Discharge status: Home / Self Care | Source: Ambulatory Visit | Attending: Family Medicine | Admitting: Family Medicine

## 2024-08-01 DIAGNOSIS — Z1231 Encounter for screening mammogram for malignant neoplasm of breast: Secondary | ICD-10-CM | POA: Diagnosis not present

## 2024-08-02 ENCOUNTER — Ambulatory Visit (INDEPENDENT_AMBULATORY_CARE_PROVIDER_SITE_OTHER): Admitting: Family Medicine

## 2024-08-02 ENCOUNTER — Encounter: Payer: Self-pay | Admitting: Family Medicine

## 2024-08-02 ENCOUNTER — Other Ambulatory Visit (HOSPITAL_COMMUNITY)
Admission: RE | Admit: 2024-08-02 | Discharge: 2024-08-02 | Disposition: A | Discharge status: Home / Self Care | Source: Ambulatory Visit | Attending: Family Medicine | Admitting: Family Medicine

## 2024-08-02 VITALS — BP 112/83 | HR 73 | Resp 16 | Wt 150.9 lb

## 2024-08-02 DIAGNOSIS — Z01419 Encounter for gynecological examination (general) (routine) without abnormal findings: Secondary | ICD-10-CM

## 2024-08-02 DIAGNOSIS — Z124 Encounter for screening for malignant neoplasm of cervix: Secondary | ICD-10-CM | POA: Diagnosis not present

## 2024-08-02 NOTE — Progress Notes (Addendum)
 "  Established Patient Office Visit  I, Curtis DELENA Boom, FNP, have reviewed all documentation for this visit. The documentation on 08/03/24 for the exam, diagnosis, procedures, and orders are all accurate and complete.   Subjective   Patient ID: Sarah Cox, female    DOB: [birth year]  Age: 63 y.o. MRN: [id number]  Chief Complaint  Patient presents with   Gynecologic Exam    Discussed the use of AI scribe software for clinical note transcription with the patient, who gave verbal consent to proceed.  History of Present Illness Sarah Cox is a 63 year old female who presents for a routine Pap smear  for cervical cancer screening  She has a history of abnormal Pap smears, though it has been a while since the last occurrence. She recently underwent a mammogram yesterday.   No other concerns today.       08/02/2024    8:33 AM 10/16/2023    1:34 PM 02/10/2023    8:56 AM  Depression screen PHQ 2/9  Decreased Interest 1 1 1   Down, Depressed, Hopeless 1 1 1   PHQ - 2 Score 2 2 2   Altered sleeping 2 2 2   Tired, decreased energy 1 2 1   Change in appetite 1 1 1   Feeling bad or failure about yourself  1 1 1   Trouble concentrating 1 1 1   Moving slowly or fidgety/restless 0 0 0  Suicidal thoughts 0 0 0  PHQ-9 Score 8 9 8   Difficult doing work/chores Somewhat difficult Not difficult at all Not difficult at all       08/02/2024    8:33 AM 10/16/2023    1:34 PM  GAD 7 : Generalized Anxiety Score  Nervous, Anxious, on Edge 1 1  Control/stop worrying 1 1  Worry too much - different things 0 1  Trouble relaxing 1 1  Restless 0 1  Easily annoyed or irritable 1 1  Afraid - awful might happen 1 1  Total GAD 7 Score 5 7  Anxiety Difficulty Somewhat difficult Not difficult at all     ROS  Negative unless indicated in HPI   Objective:     BP 112/83 (BP Location: Right Arm, Patient Position: Sitting, Cuff Size: Normal)   Pulse 73   Resp 16   Wt 150 lb 14.4 oz (68.4 kg)   LMP  07/07/2017 (Exact Date)   SpO2 100%   BMI 26.73 kg/m    Physical Exam Exam conducted with a chaperone present.  Constitutional:      Appearance: Normal appearance. She is normal weight.  Eyes:     Extraocular Movements: Extraocular movements intact.     Pupils: Pupils are equal, round, and reactive to light.  Cardiovascular:     Rate and Rhythm: Normal rate and regular rhythm.     Pulses: Normal pulses.     Heart sounds: Normal heart sounds.  Pulmonary:     Effort: Pulmonary effort is normal. No respiratory distress.     Breath sounds: Normal breath sounds. No stridor. No wheezing, rhonchi or rales.  Chest:     Comments: No breast concerns, mammo yesterday Abdominal:     Hernia: There is no hernia in the left inguinal area or right inguinal area.  Genitourinary:    Exam position: Lithotomy position.     Labia:        Right: No rash, tenderness, lesion or injury.        Left: No rash, tenderness, lesion or injury.  Urethra: No prolapse, urethral pain, urethral swelling or urethral lesion.     Vagina: Normal.     Cervix: Discharge present. No cervical motion tenderness, friability, lesion, erythema or cervical bleeding.     Uterus: Normal.      Rectum: Normal.     Comments: Scant white discharge, non bothersome to patient. Unable to palpate adnexa. External rectum normal. Lymphadenopathy:     Lower Body: No right inguinal adenopathy. No left inguinal adenopathy.  Skin:    General: Skin is warm and dry.  Neurological:     Mental Status: She is alert.     Cranial Nerves: No cranial nerve deficit.     Motor: No weakness.     Gait: Gait normal.  Psychiatric:        Mood and Affect: Mood normal.        Behavior: Behavior normal.        Thought Content: Thought content normal.        Judgment: Judgment normal.      No results found for any visits on 08/02/24.    The 10-year ASCVD risk score (Arnett DK, et al., 2019) is: 3.6%    Assessment & Plan:  Women's  annual routine gynecological examination  Screening for cervical cancer -     Cytology - PAP    Assessment and Plan Assessment & Plan Well Woman Visit Routine well woman visit with Pap smear performed.  Recent mammogram completed.  No current abnormalities observed during examination - Await Pap smear results - Refer to OB GYN if Pap smear results are abnormal   No follow-ups on file.   I, Curtis DELENA Boom, FNP, have reviewed all documentation for this visit. The documentation on 08/03/24 for the exam, diagnosis, procedures, and orders are all accurate and complete.   Curtis DELENA Boom, FNP "

## 2024-08-04 LAB — CYTOLOGY - PAP
Adequacy: ABSENT
Chlamydia: NEGATIVE
Comment: NEGATIVE
Comment: NEGATIVE
Comment: NEGATIVE
Comment: NORMAL
High risk HPV: NEGATIVE
Neisseria Gonorrhea: NEGATIVE
Trichomonas: NEGATIVE

## 2024-08-05 ENCOUNTER — Ambulatory Visit: Payer: Self-pay | Admitting: Family Medicine

## 2024-08-15 DIAGNOSIS — Z6826 Body mass index (BMI) 26.0-26.9, adult: Secondary | ICD-10-CM | POA: Diagnosis not present

## 2024-08-15 DIAGNOSIS — J0110 Acute frontal sinusitis, unspecified: Secondary | ICD-10-CM | POA: Diagnosis not present

## 2024-09-26 ENCOUNTER — Other Ambulatory Visit: Payer: Self-pay | Admitting: Family Medicine

## 2024-10-11 ENCOUNTER — Other Ambulatory Visit: Payer: Self-pay | Admitting: Family Medicine

## 2024-10-11 DIAGNOSIS — F5101 Primary insomnia: Secondary | ICD-10-CM
